# Patient Record
Sex: Male | Born: 1990 | Race: White | Hispanic: No | Marital: Married | Smoking: Current every day smoker
Health system: Southern US, Community
[De-identification: ages and names within clinical notes are randomized; demographics above are authoritative.]

## PROBLEM LIST (undated history)

## (undated) DIAGNOSIS — F1910 Other psychoactive substance abuse, uncomplicated: Secondary | ICD-10-CM

## (undated) DIAGNOSIS — F329 Major depressive disorder, single episode, unspecified: Secondary | ICD-10-CM

## (undated) DIAGNOSIS — F32A Depression, unspecified: Secondary | ICD-10-CM

## (undated) DIAGNOSIS — T7840XA Allergy, unspecified, initial encounter: Secondary | ICD-10-CM

## (undated) HISTORY — DX: Allergy, unspecified, initial encounter: T78.40XA

## (undated) HISTORY — DX: Other psychoactive substance abuse, uncomplicated: F19.10

---

## 2010-07-10 ENCOUNTER — Emergency Department (HOSPITAL_COMMUNITY): Admission: EM | Admit: 2010-07-10 | Discharge: 2010-07-10 | Payer: Self-pay | Admitting: Emergency Medicine

## 2010-07-16 ENCOUNTER — Emergency Department (HOSPITAL_COMMUNITY): Admission: EM | Admit: 2010-07-16 | Discharge: 2010-07-16 | Payer: Self-pay | Admitting: Emergency Medicine

## 2010-08-21 ENCOUNTER — Emergency Department (HOSPITAL_COMMUNITY): Admission: EM | Admit: 2010-08-21 | Discharge: 2010-08-21 | Payer: Self-pay | Admitting: Family Medicine

## 2010-12-07 ENCOUNTER — Emergency Department (HOSPITAL_COMMUNITY)
Admission: EM | Admit: 2010-12-07 | Discharge: 2010-12-07 | Disposition: A | Discharge status: Home / Self Care | Payer: Self-pay | Attending: Emergency Medicine | Admitting: Emergency Medicine

## 2010-12-07 ENCOUNTER — Emergency Department (HOSPITAL_COMMUNITY): Payer: Self-pay

## 2010-12-07 DIAGNOSIS — M25519 Pain in unspecified shoulder: Secondary | ICD-10-CM | POA: Insufficient documentation

## 2010-12-07 DIAGNOSIS — M719 Bursopathy, unspecified: Secondary | ICD-10-CM | POA: Insufficient documentation

## 2010-12-07 DIAGNOSIS — M67919 Unspecified disorder of synovium and tendon, unspecified shoulder: Secondary | ICD-10-CM | POA: Insufficient documentation

## 2012-04-14 ENCOUNTER — Emergency Department
Admission: EM | Admit: 2012-04-14 | Discharge: 2012-04-14 | Disposition: A | Discharge status: Home / Self Care | Payer: BC Managed Care – PPO | Source: Home / Self Care | Attending: Family Medicine | Admitting: Family Medicine

## 2012-04-14 DIAGNOSIS — S0501XA Injury of conjunctiva and corneal abrasion without foreign body, right eye, initial encounter: Secondary | ICD-10-CM

## 2012-04-14 DIAGNOSIS — S058X9A Other injuries of unspecified eye and orbit, initial encounter: Secondary | ICD-10-CM

## 2012-04-14 MED ORDER — HYDROCODONE-ACETAMINOPHEN 5-500 MG PO TABS: 1.0000 | ORAL_TABLET | Freq: Four times a day (QID) | ORAL | Status: AC | PRN

## 2012-04-14 MED ORDER — ERYTHROMYCIN 5 MG/GM OP OINT: TOPICAL_OINTMENT | OPHTHALMIC | Status: AC

## 2012-04-14 NOTE — ED Provider Notes (Signed)
History     CSN: [id number]  Arrival date & time 04/14/12  1304   First MD Initiated Contact with Patient 04/14/12 1355      Chief Complaint  Patient presents with  . Eye Pain    x 3 days     HPI Comments: Dakota Watson complains of watery right eye and pain for 3 days that started while he was at work. He believes the pain is due to an object in his right eye.  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  His foreign body sensation has persisted.  No changes in vision.  Patient is a 21 y.o. male presenting with eye pain. The history is provided by the patient.  Eye Pain This is a new problem. Episode onset: 3 days ago. The problem occurs constantly. The problem has not changed since onset.Associated symptoms comments: none. Exacerbated by: blinking. Nothing relieves the symptoms. Treatments tried: eye lavage. The treatment provided mild relief.    History reviewed. No pertinent past medical history.  History reviewed. No pertinent past surgical history.  Family History  Problem Relation Age of Onset  . Cancer Neg Hx     History  Substance Use Topics  . Smoking status: Current Some Day Smoker -- 4 years  . Smokeless tobacco: Never Used  . Alcohol Use: Yes      Review of Systems  Eyes: Positive for pain.  All other systems reviewed and are negative.    Allergies  Penicillins  Home Medications   Current Outpatient Rx  Name Route Sig Dispense Refill  . ERYTHROMYCIN 5 MG/GM OP OINT  Place a 1/2 inch ribbon of ointment into the lower eyelid q3 to 4hr 3.5 g 0  . HYDROCODONE-ACETAMINOPHEN 5-500 MG PO TABS Oral Take 1 tablet by mouth every 6 (six) hours as needed for pain. 10 tablet 0    BP 139/70  Pulse 76  Temp 98.1 F (36.7 C) (Oral)  Resp 16  Ht 5\' 6"  (1.676 m)  Wt 138 lb (62.596 kg)  BMI 22.27 kg/m2  SpO2 97%  Physical Exam  Nursing note and vitals  reviewed. Constitutional: He is oriented to person, place, and time. He appears well-developed and well-nourished. No distress.  HENT:  Head: Normocephalic and atraumatic.  Right Ear: External ear normal.  Left Ear: External ear normal.  Nose: Nose normal.  Mouth/Throat: Oropharynx is clear and moist.  Eyes: EOM and lids are normal. Pupils are equal, round, and reactive to light. No foreign bodies found. Right eye exhibits no chemosis, no discharge, no exudate and no hordeolum. No foreign body present in the right eye. Left eye exhibits no chemosis, no discharge and no exudate. Right conjunctiva is injected. Left conjunctiva is not injected.       Mild photophobia is present.  Lid eversion and exam reveals no foreign body.  There is a faint hint of fluorescein uptake at periphery of right cornea ("ground glass appearance").  Neck: Neck supple.  Lymphadenopathy:    He has no cervical adenopathy.  Neurological: He is alert and oriented to person, place, and time.  Skin: Skin is warm and dry. No rash noted.    ED Course  Procedures  none      1. Injury of conjunctiva and corneal abrasion of right eye without foreign body       MDM   Begin erythromycin ophthalmic ointment.  Lortab for pain. Wear sunglasses when outside. Follow up with ophthalmologist if not improved two days.        Lattie Haw, MD 04/16/12 1002

## 2012-04-14 NOTE — ED Notes (Signed)
Dakota Watson complains of watery right eye and pain for 3 days. He believes the pain is due to an object in his right eye.

## 2012-06-30 ENCOUNTER — Emergency Department (INDEPENDENT_AMBULATORY_CARE_PROVIDER_SITE_OTHER): Payer: BC Managed Care – PPO

## 2012-06-30 ENCOUNTER — Emergency Department
Admission: EM | Admit: 2012-06-30 | Discharge: 2012-06-30 | Disposition: A | Discharge status: Home / Self Care | Payer: BC Managed Care – PPO | Source: Home / Self Care | Attending: Emergency Medicine | Admitting: Emergency Medicine

## 2012-06-30 DIAGNOSIS — S335XXA Sprain of ligaments of lumbar spine, initial encounter: Secondary | ICD-10-CM

## 2012-06-30 DIAGNOSIS — S39012A Strain of muscle, fascia and tendon of lower back, initial encounter: Secondary | ICD-10-CM

## 2012-06-30 DIAGNOSIS — W1789XA Other fall from one level to another, initial encounter: Secondary | ICD-10-CM

## 2012-06-30 DIAGNOSIS — M549 Dorsalgia, unspecified: Secondary | ICD-10-CM

## 2012-06-30 MED ORDER — TRAMADOL-ACETAMINOPHEN 37.5-325 MG PO TABS: ORAL_TABLET | ORAL | Status: DC

## 2012-06-30 MED ORDER — CYCLOBENZAPRINE HCL 10 MG PO TABS: ORAL_TABLET | ORAL | Status: DC

## 2012-06-30 NOTE — ED Provider Notes (Signed)
History     CSN: [id number]  Arrival date & time 06/30/12  1654   First MD Initiated Contact with Patient 06/30/12 1700      Chief Complaint  Patient presents with  . Back Pain    Patient is a 21 y.o. male presenting with back pain. The history is provided by the patient and the spouse.  Back Pain  This is a new problem. The current episode started 6 to 12 hours ago. The problem occurs constantly. The problem has not changed since onset.Associated with: Was skateboarding and did various jumping in skateboarding moves today, and the lumbar pain started after that..--he does not recall a direct fall or "whiping out" The pain is present in the lumbar spine. Quality: Sharp. Radiates to: Right buttock. The pain is at a severity of 8/10. The pain is the same all the time. Stiffness is present: + Associated symptoms include numbness (Minimal numbness feeling in to the legs bilaterally but not into the feet). Pertinent negatives include no chest pain, no fever, no headaches, no abdominal pain, no abdominal swelling, no bowel incontinence, no perianal numbness, no bladder incontinence, no dysuria, no pelvic pain, no leg pain, no paresthesias, no paresis and no weakness. Treatments tried: Ibuprofen 800 mg a few hours ago. The treatment provided no relief.    History reviewed. No pertinent past medical history. I questioned him further about past medical history. Has a history of narcotic addiction, and is currently in an outpatient rehabilitation program. In the past, he was on some kind of medication prescribed through the rehabilitation program, but is not on that medication anymore. --We discussed that here in urgent care, we cannot prescribe any narcotics or soma (carisoprodol), and he and wife voiced understanding and agreement with this. - He and wife have lived in the area for years, but moved away and recently moved back. Their PCP is currently [facility].--Medications: Wellbutrin 150 mg  daily prescribed ongoing by his PCP.  Drug allergies: Penicillin, amoxicillin History reviewed. No pertinent past surgical history.  Family History  Problem Relation Age of Onset  . Cancer Neg Hx     History  Substance Use Topics-history of narcotic addiction   . Smoking status: Current Every Day Smoker -- 4 years  . Smokeless tobacco: Never Used  . Alcohol Use: Yes      Review of Systems  Constitutional: Negative for fever.  Cardiovascular: Negative for chest pain.  Gastrointestinal: Negative for abdominal pain and bowel incontinence.  Genitourinary: Negative for bladder incontinence, dysuria and pelvic pain.  Musculoskeletal: Positive for back pain.  Neurological: Positive for numbness (Minimal numbness feeling in to the legs bilaterally but not into the feet). Negative for weakness, headaches and paresthesias.  All other systems reviewed and are negative.    Allergies  Amoxicillin and Penicillins  Home Medications   Current Outpatient Rx  Name Route Sig Dispense Refill  . BUPROPION HCL ER (SR) 150 MG PO TB12 Oral Take 150 mg by mouth 2 (two) times daily.    . CYCLOBENZAPRINE HCL 10 MG PO TABS  One half or one tablet every 8 hours as needed for muscle relaxant 21 tablet 0  . TRAMADOL-ACETAMINOPHEN 37.5-325 MG PO TABS  1 or 2 every 4-6 hours as needed for moderate-severe pain 20 tablet 0    BP 107/74  Pulse 78  Temp 98.4 F (36.9 C) (Oral)  Resp 20  Ht 5\' 6"  (1.676 m)  Wt 140 lb (63.504 kg)  BMI 22.60  kg/m2  SpO2 98%  Physical Exam  Nursing note and vitals reviewed. Constitutional: He is oriented to person, place, and time. He appears well-developed and well-nourished. He is cooperative.  Non-toxic appearance. He appears distressed (Appears uncomfortable from low back pain.).  HENT:  Head: Normocephalic and atraumatic.  Mouth/Throat: Oropharynx is clear and moist.  Eyes: EOM are normal. Pupils are equal, round, and reactive to light. No scleral icterus.    Neck: Neck supple.  Cardiovascular: Regular rhythm and normal heart sounds.   Pulmonary/Chest: Effort normal and breath sounds normal. No respiratory distress. He has no wheezes. He has no rales. He exhibits no tenderness.  Abdominal: Soft. There is no tenderness.  Musculoskeletal:       Right hip: Normal.       Left hip: Normal.       Cervical back: He exhibits no tenderness.       Thoracic back: He exhibits no tenderness.       Lumbar back: He exhibits decreased range of motion, tenderness, bony tenderness and spasm. He exhibits no swelling, no edema, no deformity, no laceration and normal pulse.       Negative Right straight leg-raise test. Negative Left straight leg-raise test.  Negative Right Luisa Hart test. Negative Left Luisa Hart test.    Neurological: He is alert and oriented to person, place, and time. He has normal strength. He displays no atrophy, no tremor and normal reflexes. No cranial nerve deficit or sensory deficit. He exhibits normal muscle tone. Gait normal.  Reflex Scores:      Patellar reflexes are 2+ on the right side and 2+ on the left side.      Achilles reflexes are 2+ on the right side and 2+ on the left side. Skin: Skin is warm, dry and intact. No lesion and no rash noted.  Psychiatric: He has a normal mood and affect.   The above straight leg raise tests were difficult before to perform because of pain, and decreased mobility, but were negative bilaterally. And he can ambulate on his own. Addendum to lumbar back exam: He is very tender to palpation over lumbar spine and right paralumbar muscles. Some spasm right paralumbar muscles. ED Course  Procedures (including critical care time) 6:15 PM with patient and wife consent, x-ray LS-spine ordered.  Labs Reviewed - No data to display Dg Lumbar Spine 2-3 Views  06/30/2012  *RADIOLOGY REPORT*  Clinical Data: Back pain after falling from a skateboard  LUMBAR SPINE - 2-3 VIEW  Comparison: 09/14/2010  Findings:   There are five non-rib bearing lumbar type vertebral bodies. Grossly unchanged mild focal scoliotic curvature of the superior aspect of the lumbar spine, convex to the left.  No anterolisthesis or retrolisthesis.  There is unchanged mild (@ 25%) anterior wedging of the T12 and L1 vertebral bodies, grossly stable since the 08/2010 examination. Remaining vertebral body heights appear preserved.  Intervertebral disc spaces appear preserved.  Limited visualization of the bilateral SI joints and hips is normal.  Regional bowel gas pattern and soft tissues are normal.  IMPRESSION:  1.  No acute findings. 2.  Stable mild focal scoliotic curvature of the superior aspect of the lumbar spine with associated mild (approximately 25%) compression deformities of the T12 and L1 vertebral bodies.   Original Report Authenticated By: Waynard Reeds, M.D.      1. Lumbar strain       MDM  I reviewed with patient and wife that the x-ray LS-spine shows no acute abnormalities.  Specifically, compared to 08/2010 x-ray, there are no new compression fractures, or any abnormalities of the intervertebral disc spaces. I reviewed the radiology report and films myself.  Explained to patient and wife that the diagnosis likely is acute lumbar strain. He is in moderate to severe pain, and I explained limitations in pain management because of his history of narcotic addiction. He declined my offer for Toradol IM stat today. He declined any NSAID other than OTC ibuprofen, can take 800 mg by mouth every 8 hours with food when necessary moderate pain. Precautions discussed. May then take a Pepcid or other H2 blocker with this to prevent stomach upset. I gave him written prescriptions for Flexeril 10 mg, #21. One half or one every 8 hours when necessary muscle relaxant No refills and  Ultracet 37.5-325. #20, no refills. One or 2 every 4-6 hours as needed for moderate to severe pain. Precautions discussed at length. Questions invited and  answered. Other nonpharmacologic modalities discussed such as cold packs tonight, then heat tomorrow. Back care instruction sheet .  Advised him to followup with orthopedist within one week, and his wife prefers that he see someone at [facility], and they'll call for appointment tomorrow. Patient and wife agree with above plans.        Lajean Manes, MD 06/30/12 [phone]

## 2012-06-30 NOTE — ED Notes (Signed)
States his back was bothering him a couple of days ago and went skateboarding and now having intense back pain.

## 2012-07-04 ENCOUNTER — Telehealth: Payer: Self-pay | Admitting: *Deleted

## 2012-07-06 ENCOUNTER — Emergency Department
Admission: EM | Admit: 2012-07-06 | Discharge: 2012-07-06 | Disposition: A | Discharge status: Home / Self Care | Payer: BC Managed Care – PPO | Source: Home / Self Care | Attending: Family Medicine | Admitting: Family Medicine

## 2012-07-06 DIAGNOSIS — R112 Nausea with vomiting, unspecified: Secondary | ICD-10-CM

## 2012-07-06 DIAGNOSIS — F329 Major depressive disorder, single episode, unspecified: Secondary | ICD-10-CM | POA: Insufficient documentation

## 2012-07-06 DIAGNOSIS — R197 Diarrhea, unspecified: Secondary | ICD-10-CM

## 2012-07-06 DIAGNOSIS — F32A Depression, unspecified: Secondary | ICD-10-CM | POA: Insufficient documentation

## 2012-07-06 HISTORY — DX: Major depressive disorder, single episode, unspecified: F32.9

## 2012-07-06 HISTORY — DX: Depression, unspecified: F32.A

## 2012-07-06 NOTE — ED Notes (Signed)
Dakota Watson complains of nausea, vomiting and diarrhea for 4 days. He [place] the fair on Wednesday and ate fair food and his stomach as been upset since. Denies fever, chills or sweats.

## 2012-07-06 NOTE — ED Provider Notes (Signed)
History     CSN: [id number]  Arrival date & time 07/06/12  1601   First MD Initiated Contact with Patient 07/06/12 1625      Chief Complaint  Patient presents with  . Nausea    x 4 days  . Emesis  . Diarrhea      HPI Comments: Dakota Watson complains of nausea, vomiting and diarrhea for 4 days. He went to the fair 3 days ago, ate fair food and his stomach as been upset since. Denies fever, chills or sweats.  His symptoms have now essentially resolved and he can take fluids without difficulty.  His diarrhea has resolved.  The history is provided by the patient.    Past Medical History  Diagnosis Date  . Depression     History reviewed. No pertinent past surgical history.  Family History  Problem Relation Age of Onset  . Cancer Neg Hx     History  Substance Use Topics  . Smoking status: Current Every Day Smoker -- 4 years  . Smokeless tobacco: Never Used  . Alcohol Use: Yes      Review of Systems  Constitutional: Positive for fever, chills and appetite change.  HENT: Negative.   Eyes: Negative.   Respiratory: Negative.   Cardiovascular: Negative.   Gastrointestinal: Positive for nausea, vomiting and diarrhea.  Genitourinary: Negative.   Musculoskeletal: Negative.   Skin: Negative.   Neurological: Negative.     Allergies  Amoxicillin and Penicillins  Home Medications   Current Outpatient Rx  Name Route Sig Dispense Refill  . BUPROPION HCL ER (SR) 150 MG PO TB12 Oral Take 150 mg by mouth 2 (two) times daily.    . CYCLOBENZAPRINE HCL 10 MG PO TABS  One half or one tablet every 8 hours as needed for muscle relaxant 21 tablet 0  . TRAMADOL-ACETAMINOPHEN 37.5-325 MG PO TABS  1 or 2 every 4-6 hours as needed for moderate-severe pain 20 tablet 0    BP 105/72  Pulse 101  Temp 98.6 F (37 C) (Oral)  Resp 16  Ht 5\' 6"  (1.676 m)  Wt 139 lb (63.05 kg)  BMI 22.44 kg/m2  SpO2 96%  Physical Exam Nursing notes and Vital Signs reviewed. Appearance:  Patient  appears healthy, stated age, and in no acute distress Eyes:  Pupils are equal, round, and reactive to light and accomodation.  Extraocular movement is intact.  Conjunctivae are not inflamed  Nose:  Normal.  No sinus tenderness.   Pharynx:  Normal Neck:  Supple.   No adenopathy Lungs:  Clear to auscultation.  Breath sounds are equal.  Heart:  Regular rate and rhythm without murmurs, rubs, or gallops.  Abdomen:  Nontender without masses or hepatosplenomegaly.  Bowel sounds are present.  No CVA or flank tenderness.  Extremities:  No edema.  No calf tenderness Skin:  No rash present.   ED Course  Procedures        1. Nausea & vomiting   2. Diarrhea   Suspect viral gastroenteritis, resolving    MDM   Gradually increase diet as tolerated.  Avoid milk products until well. To decrease loose bowel movements mix one heaping tablespoon Citrucel (methylcellulose) in 8 oz water and drink one to three times daily.  When stools become more formed, may take Imodium (loperamide) once or twice daily to decrease stool frequency. Followup with Family Doctor if not improved in one week.         Lattie Haw, MD 07/09/12 [phone]

## 2013-01-10 ENCOUNTER — Encounter (HOSPITAL_COMMUNITY): Payer: Self-pay | Admitting: Emergency Medicine

## 2013-01-10 ENCOUNTER — Emergency Department (HOSPITAL_COMMUNITY)
Admission: EM | Admit: 2013-01-10 | Discharge: 2013-01-10 | Disposition: A | Discharge status: Home / Self Care | Payer: BC Managed Care – PPO | Attending: Emergency Medicine | Admitting: Emergency Medicine

## 2013-01-10 DIAGNOSIS — F17200 Nicotine dependence, unspecified, uncomplicated: Secondary | ICD-10-CM | POA: Insufficient documentation

## 2013-01-10 DIAGNOSIS — Z79899 Other long term (current) drug therapy: Secondary | ICD-10-CM | POA: Insufficient documentation

## 2013-01-10 DIAGNOSIS — F3289 Other specified depressive episodes: Secondary | ICD-10-CM | POA: Insufficient documentation

## 2013-01-10 DIAGNOSIS — F1120 Opioid dependence, uncomplicated: Secondary | ICD-10-CM | POA: Insufficient documentation

## 2013-01-10 DIAGNOSIS — F329 Major depressive disorder, single episode, unspecified: Secondary | ICD-10-CM | POA: Insufficient documentation

## 2013-01-10 LAB — CBC
HCT: 41.1 % (ref 39.0–52.0)
MCHC: 35.5 g/dL (ref 30.0–36.0)
Platelets: 230 10*3/uL (ref 150–400)
RDW: 11.8 % (ref 11.5–15.5)
WBC: 6.7 10*3/uL (ref 4.0–10.5)

## 2013-01-10 LAB — COMPREHENSIVE METABOLIC PANEL
ALT: 20 U/L (ref 0–53)
Alkaline Phosphatase: 100 U/L (ref 39–117)
BUN: 15 mg/dL (ref 6–23)
CO2: 27 mEq/L (ref 19–32)
GFR calc Af Amer: >90 mL/min (ref >90)
GFR calc non Af Amer: >90 mL/min (ref >90)
Glucose, Bld: 115 mg/dL — ABNORMAL HIGH (ref 70–99)
Potassium: 3.9 mEq/L (ref 3.5–5.1)
Total Bilirubin: 0.7 mg/dL (ref 0.3–1.2)
Total Protein: 7.1 g/dL (ref 6.0–8.3)

## 2013-01-10 LAB — RAPID URINE DRUG SCREEN, HOSP PERFORMED
Benzodiazepines: NOT DETECTED
Cocaine: NOT DETECTED
Opiates: NOT DETECTED

## 2013-01-10 NOTE — [facility] Counselor (Signed)
Received a call from EDP-Dr. Denton Lank to briefly meet with patient and discuss his request for opiates. Sts that he would like detox at [facility]   Writer met with patient whom stated that he was referred by a Therapist, nutritional. Explains that [facility] is a 12 month residential program to help him maintain his sobriety from opiates or any other substances he has used in the past. Patient sts he is using various opioids including, "Percocets and  "Anything with the word oxy in it". Sts he is using up to 80 mg's daily. He last used yesterday. Patient drank alcohol 1 month ago. He smoked THC 1 yr ago. No other drug use mentioned. No withdrawal symptoms at this time. No SI, HI, and AVH's.   Writer explained to patient that Opioid only detox is not offered at [facility]. Patient was offered alternative options available to assist him until he is able to start the program [facility]. Those other options include: CD-IOP, support groups such as NA, and individual therapy.

## 2013-01-10 NOTE — ED Notes (Signed)
MD at bedside. 

## 2013-01-10 NOTE — ED Notes (Signed)
Pt states " I need to detox, I was sent [facility]" Pt calm and cooperative at this time. Pt states last use was 20 hours ago.

## 2013-01-10 NOTE — ED Provider Notes (Signed)
History     CSN: [id number]  Arrival date & time 01/10/13  1533   First MD Initiated Contact with Patient 01/10/13 1553      Chief Complaint  Patient presents with  . Medical Clearance    (Consider location/radiation/quality/duration/timing/severity/associated sxs/prior treatment) The history is provided by the patient.  pt states he is here for substance abuse detox for opiate abuse.  Last used yesterday. States uses opana and/or other prescription narcotic meds. Remote hx heroine abuse. Denies etoh abuse. States last used opiates 20 hrs ago. Denies nvd. No abd pain. No fever or chills. No tremor or shakes. States recent health at baseline. States current in [facility], but states they dont do detox. Denies depression or thoughts of self harm.     Past Medical History  Diagnosis Date  . Depression     History reviewed. No pertinent past surgical history.  Family History  Problem Relation Age of Onset  . Cancer Neg Hx     History  Substance Use Topics  . Smoking status: Current Every Day Smoker -- 4 years  . Smokeless tobacco: Never Used  . Alcohol Use: Yes      Review of Systems  Constitutional: Negative for fever.  HENT: Negative for neck pain.   Eyes: Negative for redness.  Respiratory: Negative for shortness of breath.   Cardiovascular: Negative for chest pain.  Gastrointestinal: Negative for vomiting, abdominal pain and diarrhea.  Genitourinary: Negative for flank pain.  Musculoskeletal: Negative for back pain.  Skin: Negative for rash.  Neurological: Negative for headaches.  Hematological: Does not bruise/bleed easily.  Psychiatric/Behavioral: Negative for confusion.    Allergies  Amoxicillin and Penicillins  Home Medications   Current Outpatient Rx  Name  Route  Sig  Dispense  Refill  . buPROPion (WELLBUTRIN SR) 150 MG 12 hr tablet   Oral   Take 150 mg by mouth 2 (two) times daily.         . cyclobenzaprine (FLEXERIL) 10 MG  tablet      One half or one tablet every 8 hours as needed for muscle relaxant   21 tablet   0   . traMADol-acetaminophen (ULTRACET) 37.5-325 MG per tablet      1 or 2 every 4-6 hours as needed for moderate-severe pain   20 tablet   0     BP 130/73  Pulse 93  Temp(Src) 98.8 F (37.1 C) (Oral)  Resp 16  SpO2 98%  Physical Exam  Nursing note and vitals reviewed. Constitutional: He is oriented to person, place, and time. He appears well-developed and well-nourished. No distress.  HENT:  Mouth/Throat: Oropharynx is clear and moist.  Eyes: Conjunctivae are normal. Pupils are equal, round, and reactive to light.  Neck: Neck supple. No tracheal deviation present.  Cardiovascular: Normal rate, regular rhythm, normal heart sounds and intact distal pulses.   Pulmonary/Chest: Effort normal and breath sounds normal. No accessory muscle usage. No respiratory distress.  Abdominal: Soft. Bowel sounds are normal. He exhibits no distension. There is no tenderness.  Musculoskeletal: Normal range of motion. He exhibits no edema and no tenderness.  Neurological: He is alert and oriented to person, place, and time.  Skin: Skin is warm and dry. He is not diaphoretic.  Psychiatric: He has a normal mood and affect.    ED Course  Procedures (including critical care time)   Results for orders placed during the hospital encounter of 01/10/13  COMPREHENSIVE METABOLIC PANEL  Result Value Range   Sodium 136  135 - 145 mEq/L   Potassium 3.9  3.5 - 5.1 mEq/L   Chloride 99  96 - 112 mEq/L   CO2 27  19 - 32 mEq/L   Glucose, Bld 115 (*) 70 - 99 mg/dL   BUN 15  6 - 23 mg/dL   Creatinine, Ser 1.61  0.50 - 1.35 mg/dL   Calcium 9.9  8.4 - 09.6 mg/dL   Total Protein 7.1  6.0 - 8.3 g/dL   Albumin 4.6  3.5 - 5.2 g/dL   AST 24  0 - 37 U/L   ALT 20  0 - 53 U/L   Alkaline Phosphatase 100  39 - 117 U/L   Total Bilirubin 0.7  0.3 - 1.2 mg/dL   GFR calc non Af Amer >90  >90 mL/min   GFR calc Af Amer  >90  >90 mL/min  CBC      Result Value Range   WBC 6.7  4.0 - 10.5 K/uL   RBC 4.91  4.22 - 5.81 MIL/uL   Hemoglobin 14.6  13.0 - 17.0 g/dL   HCT 04.5  40.9 - 81.1 %   MCV 83.7  78.0 - 100.0 fL   MCH 29.7  26.0 - 34.0 pg   MCHC 35.5  30.0 - 36.0 g/dL   RDW 91.4  78.2 - 95.6 %   Platelets 230  150 - 400 K/uL  URINE RAPID DRUG SCREEN (HOSP PERFORMED)      Result Value Range   Opiates NONE DETECTED  NONE DETECTED   Cocaine NONE DETECTED  NONE DETECTED   Benzodiazepines NONE DETECTED  NONE DETECTED   Amphetamines NONE DETECTED  NONE DETECTED   Tetrahydrocannabinol NONE DETECTED  NONE DETECTED   Barbiturates NONE DETECTED  NONE DETECTED  ETHANOL      Result Value Range   Alcohol, Ethyl (B) <11  0 - 11 mg/dL       MDM  Labs.  Reviewed nursing notes and prior charts for additional history.   Act, [place], has assessed, states no inpt opiate detox beds available, and recommends d/c w outpt referrals.  Referrals provided to pt by act, pt calm, alert, normal vitals, [facility], appears stable for d/c.         Suzi Roots, MD 01/10/13 1736

## 2015-03-08 ENCOUNTER — Encounter (HOSPITAL_COMMUNITY): Payer: Self-pay | Admitting: Emergency Medicine

## 2015-03-08 ENCOUNTER — Emergency Department (HOSPITAL_COMMUNITY)
Admission: EM | Admit: 2015-03-08 | Discharge: 2015-03-08 | Disposition: A | Discharge status: Home / Self Care | Payer: 59 | Attending: Emergency Medicine | Admitting: Emergency Medicine

## 2015-03-08 DIAGNOSIS — Z8659 Personal history of other mental and behavioral disorders: Secondary | ICD-10-CM | POA: Diagnosis not present

## 2015-03-08 DIAGNOSIS — S30812A Abrasion of penis, initial encounter: Secondary | ICD-10-CM | POA: Diagnosis not present

## 2015-03-08 DIAGNOSIS — Z720 Tobacco use: Secondary | ICD-10-CM | POA: Insufficient documentation

## 2015-03-08 DIAGNOSIS — Y9389 Activity, other specified: Secondary | ICD-10-CM | POA: Diagnosis not present

## 2015-03-08 DIAGNOSIS — T148XXA Other injury of unspecified body region, initial encounter: Secondary | ICD-10-CM

## 2015-03-08 DIAGNOSIS — X58XXXA Exposure to other specified factors, initial encounter: Secondary | ICD-10-CM | POA: Insufficient documentation

## 2015-03-08 DIAGNOSIS — Z880 Allergy status to penicillin: Secondary | ICD-10-CM | POA: Diagnosis not present

## 2015-03-08 DIAGNOSIS — Y9289 Other specified places as the place of occurrence of the external cause: Secondary | ICD-10-CM | POA: Insufficient documentation

## 2015-03-08 DIAGNOSIS — R1030 Lower abdominal pain, unspecified: Secondary | ICD-10-CM | POA: Diagnosis present

## 2015-03-08 DIAGNOSIS — Y998 Other external cause status: Secondary | ICD-10-CM | POA: Diagnosis not present

## 2015-03-08 NOTE — ED Notes (Signed)
Pt c/o swelling and scab to groin area x 2 days. Pt states he did not know if its from having too much sex. Denies d/c

## 2015-03-08 NOTE — Discharge Instructions (Signed)
Use Neosporin as directed Abrasion An abrasion is a cut or scrape of the skin. Abrasions do not extend through all layers of the skin and most heal within 10 days. It is important to care for your abrasion properly to prevent infection. CAUSES  Most abrasions are caused by falling on, or gliding across, the ground or other surface. When your skin rubs on something, the outer and inner layer of skin rubs off, causing an abrasion. DIAGNOSIS  Your caregiver will be able to diagnose an abrasion during a physical exam.  TREATMENT  Your treatment depends on how large and deep the abrasion is. Generally, your abrasion will be cleaned with water and a mild soap to remove any dirt or debris. An antibiotic ointment may be put over the abrasion to prevent an infection. A bandage (dressing) may be wrapped around the abrasion to keep it from getting dirty.  You may need a tetanus shot if:  You cannot remember when you had your last tetanus shot.  You have never had a tetanus shot.  The injury broke your skin. If you get a tetanus shot, your arm may swell, get red, and feel warm to the touch. This is common and not a problem. If you need a tetanus shot and you choose not to have one, there is a rare chance of getting tetanus. Sickness from tetanus can be serious.  HOME CARE INSTRUCTIONS   If a dressing was applied, change it at least once a day or as directed by your caregiver. If the bandage sticks, soak it off with warm water.   Wash the area with water and a mild soap to remove all the ointment 2 times a day. Rinse off the soap and pat the area dry with a clean towel.   Reapply any ointment as directed by your caregiver. This will help prevent infection and keep the bandage from sticking. Use gauze over the wound and under the dressing to help keep the bandage from sticking.   Change your dressing right away if it becomes wet or dirty.   Only take over-the-counter or prescription medicines for  pain, discomfort, or fever as directed by your caregiver.   Follow up with your caregiver within 24-48 hours for a wound check, or as directed. If you were not given a wound-check appointment, look closely at your abrasion for redness, swelling, or pus. These are signs of infection. SEEK IMMEDIATE MEDICAL CARE IF:   You have increasing pain in the wound.   You have redness, swelling, or tenderness around the wound.   You have pus coming from the wound.   You have a fever or persistent symptoms for more than 2-3 days.  You have a fever and your symptoms suddenly get worse.  You have a bad smell coming from the wound or dressing.  MAKE SURE YOU:   Understand these instructions.  Will watch your condition.  Will get help right away if you are not doing well or get worse. Document Released: 06/21/2005 Document Revised: 08/28/2012 Document Reviewed: 08/15/2011 [facility] Patient Information 2015 [place], [place]. This information is not intended to replace advice given to you by your health care provider. Make sure you discuss any questions you have with your health care provider.
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
## 2015-03-15 ENCOUNTER — Encounter (HOSPITAL_COMMUNITY): Payer: Self-pay | Admitting: *Deleted

## 2015-03-15 ENCOUNTER — Emergency Department (HOSPITAL_COMMUNITY)
Admission: EM | Admit: 2015-03-15 | Discharge: 2015-03-15 | Disposition: A | Discharge status: Home / Self Care | Payer: 59 | Attending: Emergency Medicine | Admitting: Emergency Medicine

## 2015-03-15 ENCOUNTER — Emergency Department (HOSPITAL_COMMUNITY): Payer: 59

## 2015-03-15 DIAGNOSIS — Y92410 Unspecified street and highway as the place of occurrence of the external cause: Secondary | ICD-10-CM | POA: Insufficient documentation

## 2015-03-15 DIAGNOSIS — L02413 Cutaneous abscess of right upper limb: Secondary | ICD-10-CM | POA: Diagnosis not present

## 2015-03-15 DIAGNOSIS — Z720 Tobacco use: Secondary | ICD-10-CM | POA: Diagnosis not present

## 2015-03-15 DIAGNOSIS — Y9389 Activity, other specified: Secondary | ICD-10-CM | POA: Diagnosis not present

## 2015-03-15 DIAGNOSIS — Y998 Other external cause status: Secondary | ICD-10-CM | POA: Diagnosis not present

## 2015-03-15 DIAGNOSIS — S4991XA Unspecified injury of right shoulder and upper arm, initial encounter: Secondary | ICD-10-CM | POA: Diagnosis present

## 2015-03-15 DIAGNOSIS — S43401A Unspecified sprain of right shoulder joint, initial encounter: Secondary | ICD-10-CM | POA: Insufficient documentation

## 2015-03-15 DIAGNOSIS — Z8659 Personal history of other mental and behavioral disorders: Secondary | ICD-10-CM | POA: Insufficient documentation

## 2015-03-15 DIAGNOSIS — Z880 Allergy status to penicillin: Secondary | ICD-10-CM | POA: Diagnosis not present

## 2015-03-15 MED ORDER — SULFAMETHOXAZOLE-TRIMETHOPRIM 800-160 MG PO TABS: 1.0000 | ORAL_TABLET | Freq: Two times a day (BID) | ORAL | Status: AC

## 2015-03-15 MED ORDER — LIDOCAINE-EPINEPHRINE (PF) 2 %-1:200000 IJ SOLN
10.0000 mL | Freq: Once | INTRAMUSCULAR | Status: AC
  Administered 2015-03-15: 10 mL via INTRADERMAL

## 2015-03-15 MED ORDER — IBUPROFEN 600 MG PO TABS: 600.0000 mg | ORAL_TABLET | Freq: Four times a day (QID) | ORAL | Status: DC | PRN

## 2015-03-15 MED ORDER — CEPHALEXIN 500 MG PO CAPS: 500.0000 mg | ORAL_CAPSULE | Freq: Four times a day (QID) | ORAL | Status: DC

## 2015-03-15 MED ORDER — TRAMADOL HCL 50 MG PO TABS: 50.0000 mg | ORAL_TABLET | Freq: Four times a day (QID) | ORAL | Status: DC | PRN

## 2015-03-15 MED ORDER — OXYCODONE-ACETAMINOPHEN 5-325 MG PO TABS
1.0000 | ORAL_TABLET | Freq: Once | ORAL | Status: AC
  Administered 2015-03-15: 1 via ORAL
  Filled 2015-03-15: qty 1
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
## 2015-03-15 NOTE — Discharge Instructions (Signed)
Ibuprofen and tramadol for pain. Keep arm elevated. Take both keflex and bactrim for infection, take both until all gone. Follow up with primary care doctor or orthopedics for shoulder pain. Return in two days or see your doctor for recheck of forearm infection.     Abscess Care After An abscess (also called a boil or furuncle) is an infected area that contains a collection of pus. Signs and symptoms of an abscess include pain, tenderness, redness, or hardness, or you may feel a moveable soft area under your skin. An abscess can occur anywhere in the body. The infection may spread to surrounding tissues causing cellulitis. A cut (incision) by the surgeon was made over your abscess and the pus was drained out. Gauze may have been packed into the space to provide a drain that will allow the cavity to heal from the inside outwards. The boil may be painful for 5 to 7 days. Most people with a boil do not have high fevers. Your abscess, if seen early, may not have localized, and may not have been lanced. If not, another appointment may be required for this if it does not get better on its own or with medications. HOME CARE INSTRUCTIONS   Only take over-the-counter or prescription medicines for pain, discomfort, or fever as directed by your caregiver.  When you bathe, soak and then remove gauze or iodoform packs at least daily or as directed by your caregiver. You may then wash the wound gently with mild soapy water. Repack with gauze or do as your caregiver directs. SEEK IMMEDIATE MEDICAL CARE IF:   You develop increased pain, swelling, redness, drainage, or bleeding in the wound site.  You develop signs of generalized infection including muscle aches, chills, fever, or a general ill feeling.  An oral temperature above 102 F (38.9 C) develops, not controlled by medication. See your caregiver for a recheck if you develop any of the symptoms described above. If medications (antibiotics) were prescribed,  take them as directed. Document Released: 03/30/2005 Document Revised: 12/04/2011 Document Reviewed: 11/25/2007 [employer] Patient Information 2015 [place], [place]. This information is not intended to replace advice given to you by your health care provider. Make sure you discuss any questions you have with your health care provider. Shoulder Sprain A shoulder sprain is the result of damage to the tough, fiber-like tissues (ligaments) that help hold your shoulder in place. The ligaments may be stretched or torn. Besides the main shoulder joint (the ball and socket), there are several smaller joints that connect the bones in this area. A sprain usually involves one of those joints. Most often it is the acromioclavicular (or AC) joint. That is the joint that connects the collarbone (clavicle) and the shoulder blade (scapula) at the top point of the shoulder blade (acromion). A shoulder sprain is a mild form of what is called a shoulder separation. Recovering from a shoulder sprain may take some time. For some, pain lingers for several months. Most people recover without long term problems. CAUSES   A shoulder sprain is usually caused by some kind of trauma. This might be:  Falling on an outstretched arm.  Being hit hard on the shoulder.  Twisting the arm.  Shoulder sprains are more likely to occur in people who:  Play sports.  Have balance or coordination problems. SYMPTOMS   Pain when you move your shoulder.  Limited ability to move the shoulder.  Swelling and tenderness on top of the shoulder.  Redness or warmth in the  shoulder.  Bruising.  A change in the shape of the shoulder. DIAGNOSIS  Your healthcare provider may:  Ask about your symptoms.  Ask about recent activity that might have caused those symptoms.  Examine your shoulder. You may be asked to do simple exercises to test movement. The other shoulder will be examined for comparison.  Order some tests that provide a  look inside the body. They can show the extent of the injury. The tests could include:  X-rays.  CT (computed tomography) scan.  MRI (magnetic resonance imaging) scan. RISKS AND COMPLICATIONS  Loss of full shoulder motion.  Ongoing shoulder pain. TREATMENT  How long it takes to recover from a shoulder sprain depends on how severe it was. Treatment options may include:  Rest. You should not use the arm or shoulder until it heals.  Ice. For 2 or 3 days after the injury, put an ice pack on the shoulder up to 4 times a day. It should stay on for 15 to 20 minutes each time. Wrap the ice in a towel so it does not touch your skin.  Over-the-counter medicine to relieve pain.  A sling or brace. This will keep the arm still while the shoulder is healing.  Physical therapy or rehabilitation exercises. These will help you regain strength and motion. Ask your healthcare provider when it is OK to begin these exercises.  Surgery. The need for surgery is rare with a sprained shoulder, but some people may need surgery to keep the joint in place and reduce pain. HOME CARE INSTRUCTIONS   Ask your healthcare provider about what you should and should not do while your shoulder heals.  Make sure you know how to apply ice to the correct area of your shoulder.  Talk with your healthcare provider about which medications should be used for pain and swelling.  If rehabilitation therapy will be needed, ask your healthcare provider to refer you to a therapist. If it is not recommended, then ask about at-home exercises. Find out when exercise should begin. SEEK MEDICAL CARE IF:  Your pain, swelling, or redness at the joint increases. SEEK IMMEDIATE MEDICAL CARE IF:   You have a fever.  You cannot move your arm or shoulder. Document Released: 01/28/2009 Document Revised: 12/04/2011 Document Reviewed: 01/28/2009 [facility] Patient Information 2015 [place], [place]. This information is not intended to replace  advice given to you by your health care provider. Make sure you discuss any questions you have with your health care provider.
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
## 2015-05-26 ENCOUNTER — Ambulatory Visit (INDEPENDENT_AMBULATORY_CARE_PROVIDER_SITE_OTHER): Payer: 59 | Admitting: Family Medicine

## 2015-05-26 VITALS — BP 110/73 | HR 100 | Temp 98.7°F | Resp 17 | Ht 66.5 in | Wt 137.0 lb

## 2015-05-26 DIAGNOSIS — F4322 Adjustment disorder with anxiety: Secondary | ICD-10-CM | POA: Diagnosis not present

## 2015-05-26 DIAGNOSIS — F1921 Other psychoactive substance dependence, in remission: Secondary | ICD-10-CM

## 2015-05-26 MED ORDER — NALTREXONE HCL 50 MG PO TABS: 50.0000 mg | ORAL_TABLET | Freq: Every day | ORAL | Status: AC

## 2015-05-26 MED ORDER — GABAPENTIN 600 MG PO TABS: 600.0000 mg | ORAL_TABLET | Freq: Three times a day (TID) | ORAL | Status: DC

## 2015-05-26 MED ORDER — PROPRANOLOL HCL 20 MG PO TABS: ORAL_TABLET | ORAL | Status: AC

## 2015-05-26 MED ORDER — QUETIAPINE FUMARATE 100 MG PO TABS: 100.0000 mg | ORAL_TABLET | Freq: Every day | ORAL | Status: AC

## 2015-05-26 NOTE — Patient Instructions (Signed)
Best of luck to you in your recovery.  Continue your medications as usual You can take an inderal (propranolol) if needed prior to public speaking or other events that cause nervousness

## 2015-05-26 NOTE — Progress Notes (Signed)
[facility] [address] [place] [address] [phone]  Date:  05/26/2015   Name:  Dakota Watson   DOB:  1990-12-25   MRN:  [id number]  PCP:  PROVIDER NOT IN SYSTEM    Chief Complaint: Medication Refill   History of Present Illness:  Dakota Watson is a 24 y.o. very pleasant male patient who presents with the following:  Here as a new patient seeking refills of his medication.  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  He needs refills of his medications (which he was discharged with) as he does not have a PCP here  He was at [facility] center of [place]  He feels that so far he is doing ok- this is the first time he has been clean in about 10 years   He had been on wellbutrin in the past and might like to get back on this at some point- however he notes that he sometimes has some SE when he first goes back on it. Would like to wait and make sure he is doing well with his recovery prior to making this change  He also used some inderal while inpt- this helped him with nervousness.  He is doing an IOP and has to do public speaking through this program. Inderal helped him in this regard  He is currently living at a halfway house  Patient Active Problem List   Diagnosis Date Noted  . Depression     Past Medical History  Diagnosis Date  . Depression   . Allergy   . Substance abuse     History reviewed. No pertinent past surgical history.  Social History  Substance Use Topics  . Smoking status: Current Every Day Smoker -- 0.50 packs/day for 5 years    Types: Cigarettes  . Smokeless tobacco: Never Used  . Alcohol Use: 0.0 oz/week    0 Standard drinks or equivalent per week    Family History  Problem Relation Age of Onset  . Cancer Neg Hx     Allergies  Allergen Reactions  . Amoxicillin Rash  . Penicillins Rash    Medication list has been reviewed and updated.  Current Outpatient Prescriptions on File Prior to Visit  Medication  Sig Dispense Refill  . ibuprofen (ADVIL,MOTRIN) 600 MG tablet Take 1 tablet (600 mg total) by mouth every 6 (six) hours as needed. 30 tablet 0   No current facility-administered medications on file prior to visit.    Review of Systems:  As per HPI- otherwise negative.   Physical Examination: Filed Vitals:   05/26/15 1444  BP: 110/73  Pulse: 100  Temp: 98.7 F (37.1 C)  Resp: 17   Filed Vitals:   05/26/15 1444  Height: 5' 6.5" (1.689 m)  Weight: 137 lb (62.143 kg)   Body mass index is 21.78 kg/(m^2). Ideal Body Weight: Weight in (lb) to have BMI = 25: 156.9  GEN: WDWN, NAD, Non-toxic, A & O x 3, looks well HEENT: Atraumatic, Normocephalic. Neck supple. No masses, No LAD. Ears and Nose: No external deformity. CV: RRR, No M/G/R. No JVD. No thrill. No extra heart sounds. PULM: CTA B, no wheezes, crackles, rhonchi. No retractions. No resp. distress. No accessory muscle use. EXTR: No c/c/e NEURO Normal gait.  PSYCH: Normally interactive. Conversant. Not depressed or anxious appearing.  Calm demeanor.    Assessment and Plan: Drug dependence, in remission - Plan: QUEtiapine (SEROQUEL) 100 MG tablet, naltrexone (DEPADE) 50 MG  tablet, gabapentin (NEURONTIN) 600 MG tablet  Adjustment disorder with anxious mood - Plan: propranolol (INDERAL) 20 MG tablet  Doing well with his recovery.   Refilled seroquel, naltrexone, neurontin, inderal to use as needed for public speaking nervousness   Signed Abbe Amsterdam, MD

## 2015-07-05 ENCOUNTER — Ambulatory Visit (INDEPENDENT_AMBULATORY_CARE_PROVIDER_SITE_OTHER): Payer: 59 | Admitting: Family Medicine

## 2015-07-05 VITALS — BP 102/66 | HR 83 | Temp 99.0°F | Resp 18 | Wt 142.0 lb

## 2015-07-05 DIAGNOSIS — L03119 Cellulitis of unspecified part of limb: Secondary | ICD-10-CM

## 2015-07-05 DIAGNOSIS — F1921 Other psychoactive substance dependence, in remission: Secondary | ICD-10-CM | POA: Diagnosis not present

## 2015-07-05 DIAGNOSIS — T7840XA Allergy, unspecified, initial encounter: Secondary | ICD-10-CM

## 2015-07-05 MED ORDER — DOXYCYCLINE HYCLATE 100 MG PO TABS: 100.0000 mg | ORAL_TABLET | Freq: Two times a day (BID) | ORAL | Status: DC

## 2015-07-05 MED ORDER — GABAPENTIN 600 MG PO TABS: 600.0000 mg | ORAL_TABLET | Freq: Three times a day (TID) | ORAL | Status: AC

## 2015-07-05 MED ORDER — METHYLPREDNISOLONE ACETATE 80 MG/ML IJ SUSP
80.0000 mg | Freq: Once | INTRAMUSCULAR | Status: AC
  Administered 2015-07-05: 80 mg via INTRAMUSCULAR

## 2015-07-05 NOTE — Progress Notes (Signed)
   Subjective:    Patient ID: Dakota Watson, male    DOB: 04/22/1991, 24 y.o.   MRN: [id number] This chart was scribed for Elvina Sidle, MD by Jolene Provost, Medical Scribe. This patient was seen in Room 6 and the patient's care was started a 5:38 PM.  Chief Complaint  Patient presents with  . Rash    itchy x2 weeks     HPI HPI Comments: [name] is a 24 y.o. male who presents to [facility] complaining of itchiness all over his body. He states that he gave himself a tattoo recently and thinks it may have gotten infected. He also states that he took amoxicillin two days ago, because he was concerned that he had an infection and since that time he has been severely itchy. He has been allergic to amoxicillin in the past. He has not taken the amoxicillin in two days.    Review of Systems  Constitutional: Negative for fever and chills.  Respiratory: Negative for cough, choking, chest tightness and shortness of breath.   Skin: Positive for color change, rash and wound.       Objective:   Physical Exam  Constitutional: He is oriented to person, place, and time. He appears well-developed and well-nourished. No distress.  Pt is scratching his skin uncontrollably with visible excoriations, mild erythema surrounding recent tattoo sites.   HENT:  Head: Normocephalic and atraumatic.  Eyes: Pupils are equal, round, and reactive to light.  Neck: Neck supple.  Cardiovascular: Normal rate.   Pulmonary/Chest: Effort normal. No respiratory distress.  Musculoskeletal: Normal range of motion.  Neurological: He is alert and oriented to person, place, and time. Coordination normal.  Skin: Skin is warm and dry. He is not diaphoretic.  Psychiatric: He has a normal mood and affect. His behavior is normal.  Nursing note and vitals reviewed.   Filed Vitals:   07/05/15 1723  BP: 102/66  Pulse: 83  Temp: 99 F (37.2 C)  TempSrc: Oral  Resp: 18  Weight: 142 lb (64.411 kg)  SpO2: 98%         Assessment & Plan:   This chart was scribed in my presence and reviewed by me personally.    ICD-9-CM ICD-10-CM   1. Allergic reaction, initial encounter 995.3 T78.40XA methylPREDNISolone acetate (DEPO-MEDROL) injection 80 mg  2. Cellulitis of lower extremity, unspecified laterality 682.6 L03.119 doxycycline (VIBRA-TABS) 100 MG tablet  3. Drug dependence, in remission (HCC) 304.93 F19.21 gabapentin (NEURONTIN) 600 MG tablet     Signed, Elvina Sidle, MD   By signing my name below, I, Javier Docker, attest that this documentation has been prepared under the direction and in the presence of Elvina Sidle, MD. Electronically Signed: Javier Docker, ER Scribe. 07/05/2015. 5:39 PM.

## 2015-07-05 NOTE — Patient Instructions (Signed)
Avoid tattoos until this settles down. There is a good chance that you're allergic to amoxicillin, but you could also be allergic to the Ink in the tattoo dye.

## 2015-09-09 ENCOUNTER — Encounter (HOSPITAL_COMMUNITY): Payer: Self-pay | Admitting: Emergency Medicine

## 2015-09-09 ENCOUNTER — Observation Stay (HOSPITAL_COMMUNITY)
Admission: EM | Admit: 2015-09-09 | Discharge: 2015-09-09 | Disposition: A | Discharge status: Home / Self Care | Payer: 59 | Attending: Internal Medicine | Admitting: Internal Medicine

## 2015-09-09 DIAGNOSIS — T50904A Poisoning by unspecified drugs, medicaments and biological substances, undetermined, initial encounter: Secondary | ICD-10-CM | POA: Diagnosis not present

## 2015-09-09 DIAGNOSIS — T50992A Poisoning by other drugs, medicaments and biological substances, intentional self-harm, initial encounter: Secondary | ICD-10-CM

## 2015-09-09 DIAGNOSIS — T1491 Suicide attempt: Secondary | ICD-10-CM

## 2015-09-09 DIAGNOSIS — G928 Other toxic encephalopathy: Secondary | ICD-10-CM | POA: Diagnosis present

## 2015-09-09 DIAGNOSIS — T50901A Poisoning by unspecified drugs, medicaments and biological substances, accidental (unintentional), initial encounter: Secondary | ICD-10-CM | POA: Diagnosis present

## 2015-09-09 DIAGNOSIS — G92 Toxic encephalopathy: Secondary | ICD-10-CM | POA: Insufficient documentation

## 2015-09-09 DIAGNOSIS — T438X2A Poisoning by other psychotropic drugs, intentional self-harm, initial encounter: Principal | ICD-10-CM | POA: Insufficient documentation

## 2015-09-09 DIAGNOSIS — Y92199 Unspecified place in other specified residential institution as the place of occurrence of the external cause: Secondary | ICD-10-CM | POA: Insufficient documentation

## 2015-09-09 DIAGNOSIS — F1910 Other psychoactive substance abuse, uncomplicated: Secondary | ICD-10-CM | POA: Insufficient documentation

## 2015-09-09 DIAGNOSIS — Z79899 Other long term (current) drug therapy: Secondary | ICD-10-CM | POA: Insufficient documentation

## 2015-09-09 DIAGNOSIS — T50902A Poisoning by unspecified drugs, medicaments and biological substances, intentional self-harm, initial encounter: Secondary | ICD-10-CM

## 2015-09-09 DIAGNOSIS — F329 Major depressive disorder, single episode, unspecified: Secondary | ICD-10-CM | POA: Insufficient documentation

## 2015-09-09 DIAGNOSIS — F17210 Nicotine dependence, cigarettes, uncomplicated: Secondary | ICD-10-CM | POA: Insufficient documentation

## 2015-09-09 LAB — COMPREHENSIVE METABOLIC PANEL
ALBUMIN: 5.6 g/dL — AB (ref 3.5–5.0)
ALT: 27 U/L (ref 17–63)
AST: 29 U/L (ref 15–41)
Alkaline Phosphatase: 121 U/L (ref 38–126)
Anion gap: 10 (ref 5–15)
BILIRUBIN TOTAL: 1.2 mg/dL (ref 0.3–1.2)
BUN: 24 mg/dL — AB (ref 6–20)
CHLORIDE: 106 mmol/L (ref 101–111)
CO2: 29 mmol/L (ref 22–32)
Calcium: 10 mg/dL (ref 8.9–10.3)
Creatinine, Ser: 1.03 mg/dL (ref 0.61–1.24)
GFR calc Af Amer: >60 mL/min (ref >60)
GFR calc non Af Amer: >60 mL/min (ref >60)
GLUCOSE: 121 mg/dL — AB (ref 65–99)
POTASSIUM: 4.2 mmol/L (ref 3.5–5.1)
Sodium: 145 mmol/L (ref 135–145)
TOTAL PROTEIN: 7.8 g/dL (ref 6.5–8.1)

## 2015-09-09 LAB — CBC WITH DIFFERENTIAL/PLATELET
BASOS ABS: 0 10*3/uL (ref 0.0–0.1)
Basophils Relative: 0 %
EOS PCT: 2 %
Eosinophils Absolute: 0.2 10*3/uL (ref 0.0–0.7)
HEMATOCRIT: 45.3 % (ref 39.0–52.0)
Hemoglobin: 15.6 g/dL (ref 13.0–17.0)
LYMPHS PCT: 14 %
Lymphs Abs: 1.3 10*3/uL (ref 0.7–4.0)
MCH: 30.7 pg (ref 26.0–34.0)
MCHC: 34.4 g/dL (ref 30.0–36.0)
MCV: 89.2 fL (ref 78.0–100.0)
Monocytes Absolute: 0.8 10*3/uL (ref 0.1–1.0)
Monocytes Relative: 9 %
NEUTROS ABS: 7.3 10*3/uL (ref 1.7–7.7)
Neutrophils Relative %: 75 %
PLATELETS: 299 10*3/uL (ref 150–400)
RBC: 5.08 MIL/uL (ref 4.22–5.81)
RDW: 12.3 % (ref 11.5–15.5)
WBC: 9.6 10*3/uL (ref 4.0–10.5)

## 2015-09-09 LAB — MRSA PCR SCREENING: MRSA BY PCR: NEGATIVE

## 2015-09-09 LAB — RAPID URINE DRUG SCREEN, HOSP PERFORMED
Amphetamines: NOT DETECTED
BARBITURATES: NOT DETECTED
BENZODIAZEPINES: NOT DETECTED
COCAINE: NOT DETECTED
Opiates: NOT DETECTED
Tetrahydrocannabinol: NOT DETECTED

## 2015-09-09 LAB — SALICYLATE LEVEL: Salicylate Lvl: <4 mg/dL (ref 2.8–30.0)

## 2015-09-09 LAB — ETHANOL: Alcohol, Ethyl (B): <5 mg/dL (ref <5)

## 2015-09-09 LAB — ACETAMINOPHEN LEVEL: Acetaminophen (Tylenol), Serum: <10 ug/mL — ABNORMAL LOW (ref 10–30)

## 2015-09-09 MED ORDER — NICOTINE 14 MG/24HR TD PT24
14.0000 mg | MEDICATED_PATCH | Freq: Every day | TRANSDERMAL | Status: DC
  Filled 2015-09-09: qty 1

## 2015-09-09 MED ORDER — FOLIC ACID 1 MG PO TABS
1.0000 mg | ORAL_TABLET | Freq: Every day | ORAL | Status: DC
  Administered 2015-09-09: 1 mg via ORAL
  Filled 2015-09-09: qty 1

## 2015-09-09 MED ORDER — SODIUM CHLORIDE 0.9 % IV SOLN
INTRAVENOUS | Status: DC
  Administered 2015-09-09: 05:00:00 via INTRAVENOUS

## 2015-09-09 MED ORDER — HEPARIN SODIUM (PORCINE) 5000 UNIT/ML IJ SOLN
5000.0000 [IU] | Freq: Three times a day (TID) | INTRAMUSCULAR | Status: DC
  Administered 2015-09-09: 5000 [IU] via SUBCUTANEOUS
  Filled 2015-09-09 (×2): qty 1

## 2015-09-09 MED ORDER — ONDANSETRON HCL 4 MG/2ML IJ SOLN
4.0000 mg | Freq: Three times a day (TID) | INTRAMUSCULAR | Status: DC | PRN
  Administered 2015-09-09: 4 mg via INTRAVENOUS
  Filled 2015-09-09: qty 2

## 2015-09-09 MED ORDER — SODIUM CHLORIDE 0.9 % IJ SOLN: 3.0000 mL | Freq: Two times a day (BID) | INTRAMUSCULAR | Status: DC

## 2015-09-09 MED ORDER — VITAMIN B-1 100 MG PO TABS
100.0000 mg | ORAL_TABLET | Freq: Every day | ORAL | Status: DC
  Administered 2015-09-09: 100 mg via ORAL
  Filled 2015-09-09: qty 1

## 2015-09-09 MED ORDER — SODIUM CHLORIDE 0.9 % IV SOLN
INTRAVENOUS | Status: DC
  Administered 2015-09-09: 03:00:00 via INTRAVENOUS

## 2015-09-09 NOTE — ED Notes (Signed)
Pt. On monitor. 

## 2015-09-09 NOTE — ED Notes (Signed)
EKG given to EDP,Knapp,I.MD., for review. 

## 2015-09-09 NOTE — ED Notes (Addendum)
Spoke with Lorri from [employer] [employer], monitor -Medical clearance labs -EKG -Seizure precautions -Benzo's for any seizure activities -Marthe PatchLorri will call back with further information

## 2015-09-09 NOTE — Discharge Summary (Signed)
Physician Discharge Summary  Dakota Watson [id number] DOB: September 03, 1991 DOA: 09/09/2015  PCP: PROVIDER NOT IN SYSTEM  Admit date: 09/09/2015 Discharge date: 09/09/2015  Time spent: 25 minutes  Recommendations for Outpatient Follow-up:  1. Left AMA  Discharge Diagnoses:  Principal Problem:   Drug overdose, intentional (HCC) Active Problems:   Overdose   Drug-induced encephalopathy   Discharge Condition: Left AMA   Filed Weights   09/09/15 0500  Weight: 64.638 kg (142 lb 8 oz)    History of present illness:  24 y.o. male with h/o heroin abuse, he presents to the ED due to a drug overdose that occurred PTA. He explains that he took Phenibut: 10 scoops of 500mg  mixing powder that he ordered over the internet from [place]. Apparently Phenibut is a powdered substance that acts as an agonist of the GABA receptor similar to baclofen and /or gabapentin. Does cross the blood brain barrier. Can cause respiratory depression and bradycardia. No specific antidote or cardiotoxicity. But can take up to 36 hours to wash out of the patients system apparently.  Hospital Course:  1-Drug-induced encephalopathy due to overdose of Phenibut - -patient improving and now AAOX3 with supportive care -still with episodes of Bradycardia -following rec's from poison control and given the fact that the medication take up to 36 hours before washing out of his body. -patient decide to take his chances and sing out AMA -he was clear from psychiatry stand point and safe to pursuit detox/outpatient psychiatric treatment.  Procedures:    Consultations:  Psychiatry   Discharge Exam: Filed Vitals:   09/09/15 0500 09/09/15 1306  BP: 124/76 120/67  Pulse: 48 70  Temp: 98.2 F (36.8 C) 98.3 F (36.8 C)  Resp: 20 20    General: AAOX3, denies any acute complaints. No fever. Still with episodes of bradycardia. Patient denies SI and despite explanations and concerns from poison control services to  monitor him for 24 hours, he decide to leave AMA.   Discharge Instructions  Discharge Medication List as of 09/09/2015  5:20 PM    CONTINUE these medications which have NOT CHANGED   Details  !! gabapentin (NEURONTIN) 800 MG tablet Take 800 mg by mouth 4 (four) times daily., Starting 08/30/2015, Until Discontinued, Historical Med    !! gabapentin (NEURONTIN) 600 MG tablet Take 1 tablet (600 mg total) by mouth 3 (three) times daily., Starting 07/05/2015, Until Discontinued, Normal    naltrexone (DEPADE) 50 MG tablet Take 1 tablet (50 mg total) by mouth daily., Starting 05/26/2015, Until Discontinued, Normal    propranolol (INDERAL) 20 MG tablet Take 1 tablet by mouth daily as needed for anxiety, Normal    QUEtiapine (SEROQUEL) 100 MG tablet Take 1 tablet (100 mg total) by mouth at bedtime., Starting 05/26/2015, Until Discontinued, Normal     !! - Potential duplicate medications found. Please discuss with provider.     Allergies  Allergen Reactions  . Amoxicillin Rash  . Penicillins Rash    Has patient had a PCN reaction causing immediate rash, facial/tongue/throat swelling, SOB or lightheadedness with hypotension: Yes Has patient had a PCN reaction causing severe rash involving mucus membranes or skin necrosis: No Has patient had a PCN reaction that required hospitalization: No Has patient had a PCN reaction occurring within the last 10 years: No If all of the above answers are "NO", then may proceed with Cephalosporin use.     The results of significant diagnostics from this hospitalization (including imaging, microbiology, ancillary and laboratory) are listed below for  reference.    Significant Diagnostic Studies: No results found.  Microbiology: Recent Results (from the past 240 hour(s))  MRSA PCR Screening     Status: None   Collection Time: 09/09/15  5:38 AM  Result Value Ref Range Status   MRSA by PCR NEGATIVE NEGATIVE Final    Comment:        The GeneXpert MRSA Assay  (FDA approved for NASAL specimens only), is one component of a comprehensive MRSA colonization surveillance program. It is not intended to diagnose MRSA infection nor to guide or monitor treatment for MRSA infections.      Labs: Basic Metabolic Panel:  Recent Labs Lab 09/09/15 0229  NA 145  K 4.2  CL 106  CO2 29  GLUCOSE 121*  BUN 24*  CREATININE 1.03  CALCIUM 10.0   Liver Function Tests:  Recent Labs Lab 09/09/15 0229  AST 29  ALT 27  ALKPHOS 121  BILITOT 1.2  PROT 7.8  ALBUMIN 5.6*   CBC:  Recent Labs Lab 09/09/15 0229  WBC 9.6  NEUTROABS 7.3  HGB 15.6  HCT 45.3  MCV 89.2  PLT 299    Signed:  Vassie Loll  [facility] 09/09/2015, 5:54 PM

## 2015-09-09 NOTE — H&P (Addendum)
[facility] History and Physical  Dakota Watson BJY:[id number] DOB: 1991-05-13 DOA: 09/09/2015  Referring physician: EDP PCP: PROVIDER NOT IN SYSTEM   Chief Complaint: OD   HPI: Dakota Watson is a 24 y.o. male with h/o heroin abuse, he presents to the ED due to a drug overdose that occurred PTA.  He explains that he took Phenibut: 10 scoops of  mixing powder that he ordered over the internet from [place].  Apparently Phenibut is a powdered substance that acts as an agonist of the GABA receptor similar to baclofen and / or gabapentin.  Does cross the blood brain barrier.  Can cause respiratory depression and bradycardia.  No specific antidote or cardiotoxicity.  But can take up to 36 hours to wash out of the patients system apparently.  Review of Systems: unable to perform due to mental status  Past Medical History  Diagnosis Date  . Depression   . Allergy   . Substance abuse    History reviewed. No pertinent past surgical history. Social History:  reports that he has been smoking Cigarettes.  He has a 2.5 pack-year smoking history. He has never used smokeless tobacco. He reports that he drinks alcohol. He reports that he uses illicit drugs.  Allergies  Allergen Reactions  . Amoxicillin Rash  . Penicillins Rash    Family History  Problem Relation Age of Onset  . Cancer Neg Hx      Prior to Admission medications   Medication Sig Start Date End Date Taking? Authorizing Provider  doxycycline (VIBRA-TABS) 100 MG tablet Take 1 tablet (100 mg total) by mouth 2 (two) times daily. 07/05/15   Elvina Sidle, MD  gabapentin (NEURONTIN) 600 MG tablet Take 1 tablet (600 mg total) by mouth 3 (three) times daily. 07/05/15   Elvina Sidle, MD  ibuprofen (ADVIL,MOTRIN) 600 MG tablet Take 1 tablet (600 mg total) by mouth every 6 (six) hours as needed. Patient not taking: Reported on 07/05/2015 03/15/15   Tatyana Kirichenko, PA-C  naltrexone (DEPADE) 50 MG tablet Take 1 tablet  (50 mg total) by mouth daily. 05/26/15   Pearline Cables, MD  propranolol (INDERAL) 20 MG tablet Take 1 tablet by mouth daily as needed for anxiety 05/26/15   Gwenlyn Found Copland, MD  QUEtiapine (SEROQUEL) 100 MG tablet Take 1 tablet (100 mg total) by mouth at bedtime. 05/26/15   Pearline Cables, MD   Physical Exam: Filed Vitals:   09/09/15 0216 09/09/15 0301  BP: 128/92 95/62  Pulse: 69 63  Temp: 98.1 F (36.7 C)   Resp: 20 21    BP 95/62 mmHg  Pulse 63  Temp(Src) 98.1 F (36.7 C) (Oral)  Resp 21  SpO2 100%  General Appearance:    Sedated, confused, appears stated age  Head:    Normocephalic, atraumatic  Eyes:    PERRL, EOMI, sclera non-icteric, pupils dialated     Nose:   Nares without drainage or epistaxis. Mucosa, turbinates normal  Throat:   Moist mucous membranes. Oropharynx without erythema or exudate.  Neck:   Supple. No carotid bruits.  No thyromegaly.  No lymphadenopathy.   Back:     No CVA tenderness, no spinal tenderness  Lungs:     Clear to auscultation bilaterally, without wheezes, rhonchi or rales  Chest wall:    No tenderness to palpitation  Heart:    Regular rate and rhythm without murmurs, gallops, rubs  Abdomen:     Soft, non-tender, nondistended, normal bowel sounds, no organomegaly  Genitalia:  deferred  Rectal:    deferred  Extremities:   No clubbing, cyanosis or edema.  Pulses:   2+ and symmetric all extremities  Skin:   Skin color, texture, turgor normal, no rashes or lesions  Lymph nodes:   Cervical, supraclavicular, and axillary nodes normal  Neurologic:   MAE, is lying across the bed, speech delayed    Labs on Admission:  Basic Metabolic Panel:  Recent Labs Lab 09/09/15 0229  NA 145  K 4.2  CL 106  CO2 29  GLUCOSE 121*  BUN 24*  CREATININE 1.03  CALCIUM 10.0   Liver Function Tests:  Recent Labs Lab 09/09/15 0229  AST 29  ALT 27  ALKPHOS 121  BILITOT 1.2  PROT 7.8  ALBUMIN 5.6*   No results for input(s): LIPASE, AMYLASE  in the last 168 hours. No results for input(s): AMMONIA in the last 168 hours. CBC:  Recent Labs Lab 09/09/15 0229  WBC 9.6  NEUTROABS 7.3  HGB 15.6  HCT 45.3  MCV 89.2  PLT 299   Cardiac Enzymes: No results for input(s): CKTOTAL, CKMB, CKMBINDEX, TROPONINI in the last 168 hours.  BNP (last 3 results) No results for input(s): PROBNP in the last 8760 hours. CBG: No results for input(s): GLUCAP in the last 168 hours.  Radiological Exams on Admission: No results found.  EKG: Independently reviewed.  Assessment/Plan Principal Problem:   Drug-induced encephalopathy Active Problems:   Overdose   1. Drug-induced encephalopathy due to overdose of Phenibut - 1. Admitting patient to tele room for observation 2. Apparently this substance may take up to 36 hours to wash out 3. Per poison control there is no specific cardio toxicity to look for 4. But it can cause bradycardia and respiratory depression 5. No specific antidote 6. Per poison control: supportive care and monitoring 7. Sitter ordered to bedside 8. IVF ordered 9. Holding home meds 10. Does not sound like suicide attempt, patient admits: "Just wanted to get high"    Code Status: Full  Family Communication: No family in room Disposition Plan: Admit to obs   Time spent: 70 min  GARDNER, JARED M. [facility] Pager [phone]  If 7AM-7PM, please contact the day team taking care of the patient [email or web address] Password [email or web address] 09/09/2015, 3:56 AM

## 2015-09-09 NOTE — ED Notes (Addendum)
Pt states that he took some 'phenobute' off of the internet and now just feels bad overall. Denies hallucinations. Alert and oriented.

## 2015-09-09 NOTE — ED Provider Notes (Addendum)
CSN: [id number]     Arrival date & time 09/09/15  0159 History  By signing my name below, I, Emmanuella Mensah, attest that this documentation has been prepared under the direction and in the presence of Devoria Albe, MD at 0203. Electronically Signed: Angelene Giovanni, ED Scribe. 09/09/2015. 2:27 AM.    Chief Complaint  Patient presents with  . Drug Overdose   The history is provided by the patient. No language interpreter was used.   Level 5 caveat due to confusion  HPI Comments: Dakota Watson is a 24 y.o. male with a hx of substance abuse (heroin) who presents to the Emergency Department complaining of drug overdose that occurred PTA. He explains that he took phenibut an hour ago that he received from [place] after buying it on the internet. He states it did it to get high. He states that the "phenibut" is a powder but was unable to describe his method of use. He reports associated anxiety. Pt is unable to fully verbalize his symptoms stating that he just "feels bad". He denies hallucinations, either auditory or visual. He reports that he reports here from [facility], a sober living house. He states that he has been living there for 6 months and he is there for heroin abuse and has not had heroin since being at the house. He adds that his roommates at the facility noticed that he was acting inappropriately and brought him here.   Per roommate at [facility] house, pt took the drug yesterday as well. He reports that the pt took 10 scoops of 500 mg of the phenibut mixing the powder with water to drink at approx. 5 pm yesterday.  He had also taken a ? amount  earlier in the day. He reports associated intermittent episodes of being non-responsive with short breaths and the only way to wake him up was to heavily shake the pt.    Past Medical History  Diagnosis Date  . Depression   . Allergy   . Substance abuse    History reviewed. No pertinent past surgical history. Family History  Problem Relation  Age of Onset  . Cancer Neg Hx    Social History  Substance Use Topics  . Smoking status: Current Every Day Smoker -- 0.50 packs/day for 5 years    Types: Cigarettes  . Smokeless tobacco: Never Used  . Alcohol Use: 0.0 oz/week    0 Standard drinks or equivalent per week  states he is unemployed.    Review of Systems  Skin: Negative for wound.  Psychiatric/Behavioral: Positive for confusion. Negative for hallucinations. The patient is nervous/anxious.   All other systems reviewed and are negative.    Allergies  Amoxicillin and Penicillins  Home Medications   Prior to Admission medications   Medication Sig Start Date End Date Taking? Authorizing Provider  doxycycline (VIBRA-TABS) 100 MG tablet Take 1 tablet (100 mg total) by mouth 2 (two) times daily. 07/05/15   Elvina Sidle, MD  gabapentin (NEURONTIN) 600 MG tablet Take 1 tablet (600 mg total) by mouth 3 (three) times daily. 07/05/15   Elvina Sidle, MD  ibuprofen (ADVIL,MOTRIN) 600 MG tablet Take 1 tablet (600 mg total) by mouth every 6 (six) hours as needed. Patient not taking: Reported on 07/05/2015 03/15/15   Tatyana Kirichenko, PA-C  naltrexone (DEPADE) 50 MG tablet Take 1 tablet (50 mg total) by mouth daily. 05/26/15   Gwenlyn Found Copland, MD  propranolol (INDERAL) 20 MG tablet Take 1 tablet by mouth daily as needed  for anxiety 05/26/15   Pearline Cables, MD  QUEtiapine (SEROQUEL) 100 MG tablet Take 1 tablet (100 mg total) by mouth at bedtime. 05/26/15   Gwenlyn Found Copland, MD   BP 128/92 mmHg  Pulse 69  Temp(Src) 98.1 F (36.7 C) (Oral)  Resp 20  SpO2 100%  Vital signs normal   Physical Exam  Constitutional: He appears well-developed and well-nourished.  Non-toxic appearance. He does not appear ill. No distress.  HENT:  Head: Normocephalic and atraumatic.  Right Ear: External ear normal.  Left Ear: External ear normal.  Nose: Nose normal. No mucosal edema or rhinorrhea.  Mouth/Throat: Oropharynx is clear and  moist and mucous membranes are normal. No dental abscesses or uvula swelling.  Eyes: Conjunctivae and EOM are normal. Pupils are equal, round, and reactive to light.  Pupils are dilated.   Neck: Normal range of motion and full passive range of motion without pain. Neck supple.  Cardiovascular: Normal rate, regular rhythm and normal heart sounds.  Exam reveals no gallop and no friction rub.   No murmur heard. Pulmonary/Chest: Effort normal and breath sounds normal. No respiratory distress. He has no wheezes. He has no rhonchi. He has no rales. He exhibits no tenderness and no crepitus.  Abdominal: Soft. Normal appearance and bowel sounds are normal. He exhibits no distension. There is no tenderness. There is no rebound and no guarding.  Musculoskeletal: Normal range of motion. He exhibits no edema or tenderness.  Moves all extremities well.   Neurological: He is alert. He has normal strength. No cranial nerve deficit.  Pt is confused and has difficulty answering simple yes or no questions.   Skin: Skin is warm, dry and intact. No rash noted. No erythema. No pallor.  Psychiatric: His mood appears anxious. His speech is delayed. He is slowed. Cognition and memory are impaired.  Pt becomes tearful because he cannot answer questions.   Nursing note and vitals reviewed.   ED Course  Procedures (including critical care time)  Medications  0.9 %  sodium chloride infusion ( Intravenous New Bag/Given 09/09/15 0239)  heparin injection 5,000 Units (not administered)  sodium chloride 0.9 % injection 3 mL (not administered)  0.9 %  sodium chloride infusion (not administered)    DIAGNOSTIC STUDIES: Oxygen Saturation is 100% on RA, normal by my interpretation.    COORDINATION OF CARE: 2:24 AM- Pt will receive blood work for further evaluation and will research the medication for appropriate treatment.   02:30 Patti, poison control, states not cardiac toxicity, rarely have needed intubation,  supportive care, if not baseline in 6 hrs post ingestion needs admission to tele  03:00 AM pt is laying on his side on stretcher, still not able to communicate, waiting for labs so he can be admitted.   03:33 Dr Julian Reil, will see patient and admit.    Labs Review Results for orders placed or performed during the hospital encounter of 09/09/15  Acetaminophen level  Result Value Ref Range   Acetaminophen (Tylenol), Serum <10 (L) 10 - 30 ug/mL  Comprehensive metabolic panel  Result Value Ref Range   Sodium 145 135 - 145 mmol/L   Potassium 4.2 3.5 - 5.1 mmol/L   Chloride 106 101 - 111 mmol/L   CO2 29 22 - 32 mmol/L   Glucose, Bld 121 (H) 65 - 99 mg/dL   BUN 24 (H) 6 - 20 mg/dL   Creatinine, Ser 1.61 0.61 - 1.24 mg/dL   Calcium 09.6 8.9 - 04.5 mg/dL  Total Protein 7.8 6.5 - 8.1 g/dL   Albumin 5.6 (H) 3.5 - 5.0 g/dL   AST 29 15 - 41 U/L   ALT 27 17 - 63 U/L   Alkaline Phosphatase 121 38 - 126 U/L   Total Bilirubin 1.2 0.3 - 1.2 mg/dL   GFR calc non Af Amer >60 >60 mL/min   GFR calc Af Amer >60 >60 mL/min   Anion gap 10 5 - 15  Ethanol  Result Value Ref Range   Alcohol, Ethyl (B) <5 <5 mg/dL  Salicylate level  Result Value Ref Range   Salicylate Lvl <4.0 2.8 - 30.0 mg/dL  CBC with Differential  Result Value Ref Range   WBC 9.6 4.0 - 10.5 K/uL   RBC 5.08 4.22 - 5.81 MIL/uL   Hemoglobin 15.6 13.0 - 17.0 g/dL   HCT 69.645.3 29.539.0 - 28.452.0 %   MCV 89.2 78.0 - 100.0 fL   MCH 30.7 26.0 - 34.0 pg   MCHC 34.4 30.0 - 36.0 g/dL   RDW 13.212.3 44.011.5 - 10.215.5 %   Platelets 299 150 - 400 K/uL   Neutrophils Relative % 75 %   Neutro Abs 7.3 1.7 - 7.7 K/uL   Lymphocytes Relative 14 %   Lymphs Abs 1.3 0.7 - 4.0 K/uL   Monocytes Relative 9 %   Monocytes Absolute 0.8 0.1 - 1.0 K/uL   Eosinophils Relative 2 %   Eosinophils Absolute 0.2 0.0 - 0.7 K/uL   Basophils Relative 0 %   Basophils Absolute 0.0 0.0 - 0.1 K/uL   Laboratory interpretation all normal except minor elevation of BUN c/w  dehydration     EKG Interpretation  Date/Time:  Thursday September 09 2015 02:24:26 EST Ventricular Rate:  65 PR Interval:  157 QRS Duration: 67 QT Interval:  382 QTC Calculation: 397 R Axis:   76 Text Interpretation:  Sinus arrhythmia Normal ECG No old tracing to compare Confirmed by Sayer Masini  MD-I, Yoselin Amerman ([id number]) on 09/09/2015 2:28:08 AM        Devoria AlbeIva Chade Pitner, MD has personally reviewed and evaluated these images and lab results as part of her medical decision-making.  MDM   Final diagnoses:  Drug overdose, intentional, initial encounter (HCC)  Drug-induced encephalopathy   Plan admission  Devoria AlbeIva Atianna Haidar, MD, FACEP  CRITICAL CARE Performed by: Devoria AlbeKNAPP,Anabella Capshaw L Total critical care time: 31  minutes Critical care time was exclusive of separately billable procedures and treating other patients. Critical care was necessary to treat or prevent imminent or life-threatening deterioration. Critical care was time spent personally by me on the following activities: development of treatment plan with patient and/or surrogate as well as nursing, discussions with consultants, evaluation of patient's response to treatment, examination of patient, obtaining history from patient or surrogate, ordering and performing treatments and interventions, ordering and review of laboratory studies, ordering and review of radiographic studies, pulse oximetry and re-evaluation of patient's condition.    I personally performed the services described in this documentation, which was scribed in my presence. The recorded information has been reviewed and considered.  Devoria AlbeIva Terissa Haffey, MD, Concha PyoFACEP   Camylle Whicker, MD 09/09/15 [id number]  Devoria AlbeIva Earley Grobe, MD 09/09/15 [phone]

## 2015-09-09 NOTE — Progress Notes (Signed)
Pt wants to leave AMA & sign the form, doesn't like to stay here 1 more day. Despite all the explanations, pt still wants to leave.  Friends at bedside to pick up pt.

## 2015-09-09 NOTE — Progress Notes (Signed)
Patient seen and examined. Admitted after midnight secondary to drug overdose. He is alert, awake and oriented X 3 on exam. No CP and no SOB. Patient is afebrile and only abnormality is HR in the 40's range. Please refer to H&P written by Dr. Julian ReilGardner for further info/details on admission.  Plan: -will have psych to see due to OD and clearance given hx of depression; sitter remains at bedside until evaluation -advance diet -monitor on telemetry   Dakota Watson, [name] [phone]

## 2015-09-09 NOTE — ED Notes (Signed)
MD at bedside. 

## 2015-09-09 NOTE — Consult Note (Signed)
[facility] Consult   Reason for Consult:  Polysubstance abuse and intentional drug overdose Referring Physician:  DR. Dyann Kief Patient Identification: Dakota Watson MRN:  [id number] Principal Diagnosis: Drug overdose, intentional [facility]) Diagnosis:   Patient Active Problem List   Diagnosis Date Noted  . Overdose [T50.901A] 09/09/2015  . Drug-induced encephalopathy [G92] 09/09/2015  . Drug overdose, intentional ([place]) [T50.902A]   . Depression [F32.9]     Total Time spent with patient: 1 hour  Subjective:   Dakota Watson is a 24 y.o. male patient admitted with drug overdose.  HPI:  Dakota Watson is a 24 y.o. male seen, chart reviewed for psychiatric consultation and evaluation of intentional/accidental overdose of Phenibut. Patient stated that he is a resident of [place] over 8 years and recently completed 28 days of substance abuse rehabilitation at [facility]. 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 Patient denies current symptoms of depression, anxiety, psychosis, suicidal/homicidal ideation, intention or plans. Patient is willing to participate in intensive outpatient program when medically cleared.   He explains that he took Phenibut: 10 scoops of 56m mixing powder that he ordered over the internet from [place] Apparently Phenibut is a powdered substance that acts as an agonist of the GABA receptor similar to baclofen and / or gabapentin. Does cross the blood brain barrier. Can cause respiratory depression and bradycardia. No specific antidote or cardiotoxicity. But can take up to 36 hours to wash out of the patients system apparently  Past Psychiatric History: [place] [facility]  [place]    Risk to Self: Is patient at risk for suicide?: No Risk to Others:   Prior Inpatient Therapy:   Prior Outpatient Therapy:    Past Medical History:  Past Medical History  Diagnosis Date  . Depression   . Allergy   . Substance abuse    History reviewed. No pertinent past surgical history. Family History:  Family History  Problem Relation Age of Onset  . Cancer Neg Hx    Family Psychiatric  History: Significant for the polysubstance abuse multiple family members at paternal side of the family. Social History:  History  Alcohol Use  . 0.0 oz/week  . 0 Standard drinks or equivalent per week     History  Drug Use  . Yes    Social History   Social History  . Marital Status: Married    Spouse Name: N/A  . Number of Children: N/A  . Years of Education: N/A   Social History Main Topics  . Smoking status: Current Every Day Smoker -- 0.50 packs/day for 5 years    Types: Cigarettes  . Smokeless tobacco: Never Used  . Alcohol Use: 0.0 oz/week    0 Standard drinks or equivalent per week  . Drug Use: Yes  . Sexual Activity: No   Other Topics Concern  . None   Social History Narrative   Additional Social History:                          Allergies:   Allergies  Allergen Reactions  . Amoxicillin Rash  . Penicillins Rash    Labs:  Results for orders placed or performed during the hospital encounter of 09/09/15 (from  the past 48 hour(s))  Acetaminophen level     Status: Abnormal   Collection Time: 09/09/15  2:29 AM  Result Value Ref Range   Acetaminophen (Tylenol), Serum <10 (L) 10 - 30 ug/mL    Comment:        THERAPEUTIC CONCENTRATIONS VARY SIGNIFICANTLY. A RANGE OF 10-30 ug/mL MAY BE AN EFFECTIVE CONCENTRATION FOR MANY PATIENTS. HOWEVER, SOME ARE BEST TREATED AT CONCENTRATIONS OUTSIDE THIS RANGE. ACETAMINOPHEN CONCENTRATIONS >150 ug/mL AT 4 HOURS AFTER INGESTION AND >50 ug/mL AT 12 HOURS AFTER INGESTION ARE OFTEN ASSOCIATED  WITH TOXIC REACTIONS.   Comprehensive metabolic panel     Status: Abnormal   Collection Time: 09/09/15  2:29 AM  Result Value Ref Range   Sodium 145 135 - 145 mmol/L   Potassium 4.2 3.5 - 5.1 mmol/L   Chloride 106 101 - 111 mmol/L   CO2 29 22 - 32 mmol/L   Glucose, Bld 121 (H) 65 - 99 mg/dL   BUN 24 (H) 6 - 20 mg/dL   Creatinine, Ser 1.03 0.61 - 1.24 mg/dL   Calcium 10.0 8.9 - 10.3 mg/dL   Total Protein 7.8 6.5 - 8.1 g/dL   Albumin 5.6 (H) 3.5 - 5.0 g/dL   AST 29 15 - 41 U/L   ALT 27 17 - 63 U/L   Alkaline Phosphatase 121 38 - 126 U/L   Total Bilirubin 1.2 0.3 - 1.2 mg/dL   GFR calc non Af Amer >60 >60 mL/min   GFR calc Af Amer >60 >60 mL/min    Comment: (NOTE) The eGFR has been calculated using the CKD EPI equation. This calculation has not been validated in all clinical situations. eGFR's persistently <60 mL/min signify possible Chronic Kidney Disease.    Anion gap 10 5 - 15  Ethanol     Status: None   Collection Time: 09/09/15  2:29 AM  Result Value Ref Range   Alcohol, Ethyl (B) <5 <5 mg/dL    Comment:        LOWEST DETECTABLE LIMIT FOR SERUM ALCOHOL IS 5 mg/dL FOR MEDICAL PURPOSES ONLY   Salicylate level     Status: None   Collection Time: 09/09/15  2:29 AM  Result Value Ref Range   Salicylate Lvl <3.7 2.8 - 30.0 mg/dL  CBC with Differential     Status: None   Collection Time: 09/09/15  2:29 AM  Result Value Ref Range   WBC 9.6 4.0 - 10.5 K/uL   RBC 5.08 4.22 - 5.81 MIL/uL   Hemoglobin 15.6 13.0 - 17.0 g/dL   HCT 45.3 39.0 - 52.0 %   MCV 89.2 78.0 - 100.0 fL   MCH 30.7 26.0 - 34.0 pg   MCHC 34.4 30.0 - 36.0 g/dL   RDW 12.3 11.5 - 15.5 %   Platelets 299 150 - 400 K/uL   Neutrophils Relative % 75 %   Neutro Abs 7.3 1.7 - 7.7 K/uL   Lymphocytes Relative 14 %   Lymphs Abs 1.3 0.7 - 4.0 K/uL   Monocytes Relative 9 %   Monocytes Absolute 0.8 0.1 - 1.0 K/uL   Eosinophils Relative 2 %   Eosinophils Absolute 0.2 0.0 - 0.7 K/uL   Basophils Relative 0 %    Basophils Absolute 0.0 0.0 - 0.1 K/uL  MRSA PCR Screening     Status: None   Collection Time: 09/09/15  5:38 AM  Result Value Ref Range   MRSA by PCR NEGATIVE NEGATIVE    Comment:  The GeneXpert MRSA Assay (FDA approved for NASAL specimens only), is one component of a comprehensive MRSA colonization surveillance program. It is not intended to diagnose MRSA infection nor to guide or monitor treatment for MRSA infections.     Current Facility-Administered Medications  Medication Dose Route Frequency Provider Last Rate Last Dose  . 0.9 %  sodium chloride infusion   Intravenous Continuous Rolland Porter, MD 100 mL/hr at 09/09/15 0239    . 0.9 %  sodium chloride infusion   Intravenous Continuous Etta Quill, DO 125 mL/hr at 09/09/15 0510    . folic acid (FOLVITE) tablet 1 mg  1 mg Oral Daily Barton Dubois, MD   1 mg at 09/09/15 3419  . heparin injection 5,000 Units  5,000 Units Subcutaneous 3 times per day Etta Quill, DO   5,000 Units at 09/09/15 0608  . nicotine (NICODERM CQ - dosed in mg/24 hours) patch 14 mg  14 mg Transdermal Daily Barton Dubois, MD      . sodium chloride 0.9 % injection 3 mL  3 mL Intravenous Q12H Jared M Gardner, DO      . thiamine (VITAMIN B-1) tablet 100 mg  100 mg Oral Daily Barton Dubois, MD   100 mg at 09/09/15 3790    Musculoskeletal: Strength & Muscle Tone: within normal limits Gait & Station: normal Patient leans: N/A  Psychiatric Specialty Exam: ROS  No Fever-chills, No Headache, No changes with Vision or hearing, reports vertigo No problems swallowing food or Liquids, No Chest pain, Cough or Shortness of Breath, No Abdominal pain, No Nausea or Vommitting, Bowel movements are regular, No Blood in stool or Urine, No dysuria, No new skin rashes or bruises, No new joints pains-aches,  No new weakness, tingling, numbness in any extremity, No recent weight gain or loss, No polyuria, polydypsia or polyphagia,   A full 10 point Review  of Systems was done, except as stated above, all other Review of Systems were negative.  Blood pressure 124/76, pulse 48, temperature 98.2 F (36.8 C), temperature source Oral, resp. rate 20, weight 64.638 kg (142 lb 8 oz), SpO2 97 %.Body mass index is 22.66 kg/(m^2).  General Appearance: Casual  Eye Contact::  Good  Speech:  Clear and Coherent  Volume:  Normal  Mood:  Euthymic  Affect:  Appropriate and Congruent  Thought Process:  Coherent and Goal Directed  Orientation:  Full (Time, Place, and Person)  Thought Content:  WDL  Suicidal Thoughts:  No  Homicidal Thoughts:  No  Memory:  Immediate;   Good Recent;   Good  Judgement:  Impaired  Insight:  Fair  Psychomotor Activity:  Decreased  Concentration:  Good  Recall:  Good  Fund of Knowledge:Good  Language: Good  Akathisia:  Negative  Handed:  Right  AIMS (if indicated):     Assets:  Communication Skills Desire for Improvement Housing Leisure Time Resilience Social Support  ADL's:  Intact  Cognition: WNL  Sleep:      Treatment Plan Summary: Daily contact with patient to assess and evaluate symptoms and progress in treatment and Medication management  Refer to the unit social worker regarding appropriate placement needs when medically stable.  Disposition: Patient does not meet criteria for psychiatric inpatient admission. Supportive therapy provided about ongoing stressors. Refer to IOP. chemical dependency intensive outpatient program    Shandee Jergens,JANARDHAHA R. 09/09/2015 11:52 AM

## 2015-09-09 NOTE — ED Notes (Signed)
Admitting MD at bedside.

## 2015-09-17 ENCOUNTER — Encounter (HOSPITAL_COMMUNITY): Payer: Self-pay | Admitting: Emergency Medicine

## 2015-09-17 ENCOUNTER — Emergency Department (HOSPITAL_COMMUNITY)
Admission: EM | Admit: 2015-09-17 | Discharge: 2015-09-17 | Disposition: A | Discharge status: Home / Self Care | Payer: 59 | Attending: Emergency Medicine | Admitting: Emergency Medicine

## 2015-09-17 DIAGNOSIS — F329 Major depressive disorder, single episode, unspecified: Secondary | ICD-10-CM | POA: Insufficient documentation

## 2015-09-17 DIAGNOSIS — Z880 Allergy status to penicillin: Secondary | ICD-10-CM | POA: Insufficient documentation

## 2015-09-17 DIAGNOSIS — T50904A Poisoning by unspecified drugs, medicaments and biological substances, undetermined, initial encounter: Secondary | ICD-10-CM

## 2015-09-17 DIAGNOSIS — F17210 Nicotine dependence, cigarettes, uncomplicated: Secondary | ICD-10-CM | POA: Insufficient documentation

## 2015-09-17 DIAGNOSIS — Y9389 Activity, other specified: Secondary | ICD-10-CM | POA: Insufficient documentation

## 2015-09-17 DIAGNOSIS — Z79899 Other long term (current) drug therapy: Secondary | ICD-10-CM | POA: Insufficient documentation

## 2015-09-17 DIAGNOSIS — Y998 Other external cause status: Secondary | ICD-10-CM | POA: Insufficient documentation

## 2015-09-17 DIAGNOSIS — T426X4A Poisoning by other antiepileptic and sedative-hypnotic drugs, undetermined, initial encounter: Secondary | ICD-10-CM | POA: Insufficient documentation

## 2015-09-17 DIAGNOSIS — F1410 Cocaine abuse, uncomplicated: Secondary | ICD-10-CM | POA: Insufficient documentation

## 2015-09-17 DIAGNOSIS — Y9289 Other specified places as the place of occurrence of the external cause: Secondary | ICD-10-CM | POA: Insufficient documentation

## 2015-09-17 LAB — CBC WITH DIFFERENTIAL/PLATELET
Basophils Absolute: 0 10*3/uL (ref 0.0–0.1)
Basophils Relative: 0 %
Eosinophils Absolute: 0 10*3/uL (ref 0.0–0.7)
Eosinophils Relative: 0 %
HCT: 40.8 % (ref 39.0–52.0)
Hemoglobin: 14.2 g/dL (ref 13.0–17.0)
Lymphocytes Relative: 11 %
Lymphs Abs: 1.2 10*3/uL (ref 0.7–4.0)
MCH: 30.8 pg (ref 26.0–34.0)
MCHC: 34.8 g/dL (ref 30.0–36.0)
MCV: 88.5 fL (ref 78.0–100.0)
Monocytes Absolute: 0.4 10*3/uL (ref 0.1–1.0)
Monocytes Relative: 4 %
Neutro Abs: 9.3 10*3/uL — ABNORMAL HIGH (ref 1.7–7.7)
Neutrophils Relative %: 85 %
Platelets: 229 10*3/uL (ref 150–400)
RBC: 4.61 MIL/uL (ref 4.22–5.81)
RDW: 12.2 % (ref 11.5–15.5)
WBC: 10.9 10*3/uL — ABNORMAL HIGH (ref 4.0–10.5)

## 2015-09-17 LAB — BASIC METABOLIC PANEL
Anion gap: 11 (ref 5–15)
BUN: 22 mg/dL — ABNORMAL HIGH (ref 6–20)
CO2: 31 mmol/L (ref 22–32)
Calcium: 9.5 mg/dL (ref 8.9–10.3)
Chloride: 98 mmol/L — ABNORMAL LOW (ref 101–111)
Creatinine, Ser: 1.01 mg/dL (ref 0.61–1.24)
GFR calc Af Amer: >60 mL/min (ref >60)
GFR calc non Af Amer: >60 mL/min (ref >60)
Glucose, Bld: 136 mg/dL — ABNORMAL HIGH (ref 65–99)
Potassium: 4.7 mmol/L (ref 3.5–5.1)
Sodium: 140 mmol/L (ref 135–145)

## 2015-09-17 LAB — RAPID URINE DRUG SCREEN, HOSP PERFORMED
Amphetamines: NOT DETECTED
Barbiturates: NOT DETECTED
Benzodiazepines: NOT DETECTED
Cocaine: POSITIVE — AB
Opiates: NOT DETECTED
Tetrahydrocannabinol: NOT DETECTED

## 2015-09-17 LAB — ETHANOL: Alcohol, Ethyl (B): <5 mg/dL (ref <5)

## 2015-09-17 MED ORDER — ACETAMINOPHEN 325 MG PO TABS
650.0000 mg | ORAL_TABLET | Freq: Once | ORAL | Status: AC
  Administered 2015-09-17: 650 mg via ORAL
  Filled 2015-09-17: qty 2

## 2015-09-17 MED ORDER — KETOROLAC TROMETHAMINE 30 MG/ML IJ SOLN
30.0000 mg | Freq: Once | INTRAMUSCULAR | Status: AC
  Administered 2015-09-17: 30 mg via INTRAVENOUS
  Filled 2015-09-17: qty 1

## 2015-09-17 MED ORDER — SODIUM CHLORIDE 0.9 % IV BOLUS (SEPSIS)
1000.0000 mL | Freq: Once | INTRAVENOUS | Status: AC
  Administered 2015-09-17: 1000 mL via INTRAVENOUS

## 2015-09-17 NOTE — ED Provider Notes (Signed)
CSN: [id number]     Arrival date & time 09/17/15  0222 History   First MD Initiated Contact with Patient 09/17/15 0236     Chief Complaint  Patient presents with  . Ingestion     (Consider location/radiation/quality/duration/timing/severity/associated sxs/prior Treatment) Patient is a 24 y.o. male presenting with Ingested Medication.  Ingestion   Patient presents to the emergency department with an episode where he was asleep and the roommate states that the patient had some snoring breathing and seemed to turn bluish color.  Patient did not have any seizure-like activity.  The patient was awakened by the roommate who states that he was seen in his normal state when he awoke him and was not difficult to arouse.  Patient does not have any chest pain, shortness breath, weakness, dizziness, vision, back pain, neck pain, fever, lethargy, near syncope, lightheadedness, nausea, vomiting, diarrhea, abdominal pain, or syncope.  She does complain of toothache and headache.  Patient does have history of substance abuse, specifically narcotics and cocaine.  Patient states he took 3 gabapentin 3 times today they were 800 mg tablets Past Medical History  Diagnosis Date  . Depression   . Allergy   . Substance abuse    History reviewed. No pertinent past surgical history. Family History  Problem Relation Age of Onset  . Cancer Neg Hx    Social History  Substance Use Topics  . Smoking status: Current Every Day Smoker -- 0.50 packs/day for 5 years    Types: Cigarettes  . Smokeless tobacco: Never Used  . Alcohol Use: No     Comment: former    Review of Systems  All other systems negative except as documented in the HPI. All pertinent positives and negatives as reviewed in the HPI.  Allergies  Amoxicillin and Penicillins  Home Medications   Prior to Admission medications   Medication Sig Start Date End Date Taking? Authorizing Provider  gabapentin (NEURONTIN) 800 MG tablet Take 800 mg by  mouth 4 (four) times daily. 08/30/15  Yes Historical Provider, MD  gabapentin (NEURONTIN) 600 MG tablet Take 1 tablet (600 mg total) by mouth 3 (three) times daily. Patient not taking: Reported on 09/09/2015 07/05/15   Elvina SidleKurt Lauenstein, MD  naltrexone (DEPADE) 50 MG tablet Take 1 tablet (50 mg total) by mouth daily. Patient not taking: Reported on 09/09/2015 05/26/15   Pearline CablesJessica C Copland, MD  propranolol (INDERAL) 20 MG tablet Take 1 tablet by mouth daily as needed for anxiety Patient not taking: Reported on 09/09/2015 05/26/15   Pearline CablesJessica C Copland, MD  QUEtiapine (SEROQUEL) 100 MG tablet Take 1 tablet (100 mg total) by mouth at bedtime. Patient not taking: Reported on 09/09/2015 05/26/15   Gwenlyn FoundJessica C Copland, MD   BP 145/81 mmHg  Pulse 110  Temp(Src) 98.7 F (37.1 C) (Oral)  Resp 20  SpO2 97% Physical Exam  Constitutional: He is oriented to person, place, and time. He appears well-developed and well-nourished. No distress.  HENT:  Head: Normocephalic and atraumatic.  Mouth/Throat: Oropharynx is clear and moist.  Eyes: Pupils are equal, round, and reactive to light.  Neck: Normal range of motion. Neck supple.  Cardiovascular: Normal rate, regular rhythm and normal heart sounds.  Exam reveals no gallop and no friction rub.   No murmur heard. Pulmonary/Chest: Effort normal and breath sounds normal. No respiratory distress. He has no wheezes.  Abdominal: Soft. Bowel sounds are normal. He exhibits no distension. There is no tenderness.  Neurological: He is alert and oriented to person,  place, and time. He exhibits normal muscle tone. Coordination normal.  Skin: Skin is warm and dry. No rash noted. No erythema.  Psychiatric: He has a normal mood and affect. His behavior is normal.  Nursing note and vitals reviewed.   ED Course  Procedures (including critical care time) Labs Review Labs Reviewed  BASIC METABOLIC PANEL - Abnormal; Notable for the following:    Chloride 98 (*)    Glucose, Bld  136 (*)    BUN 22 (*)    All other components within normal limits  CBC WITH DIFFERENTIAL/PLATELET - Abnormal; Notable for the following:    WBC 10.9 (*)    Neutro Abs 9.3 (*)    All other components within normal limits  URINE RAPID DRUG SCREEN, HOSP PERFORMED - Abnormal; Notable for the following:    Cocaine POSITIVE (*)    All other components within normal limits  ETHANOL    Imaging Review No results found. I have personally reviewed and evaluated these images and lab results as part of my medical decision-making.   EKG Interpretation None     Patient will be discharged home, told to return here as needed.  The patient's been stable here in the emergency Department.  No acute distress.  The patient took her gabapentin.  For pain control and this is a drug that can be used for getting high, at higher doses.  The patient is advised return here as needed.  Patient has had recent hospital visit for over using another medication   Charlestine Night, PA-C 09/17/15 0546  Dione Booze, MD 09/17/15 [id number]

## 2015-09-17 NOTE — Discharge Instructions (Signed)
Return here as needed.  Follow-up with a primary care doctor, increase her fluid intake and rest as much possible

## 2015-09-17 NOTE — ED Notes (Signed)
Pt states he has had a migraine headache all day to the point he had vomiting  Pt's friend states tonight pt was sitting up in a chair and made a gurgling noise in his throat, eyes closed, and pt turned blue in his face  Episode lasted about 10 seconds  Pt states he also took too much gabapentin today  Pt states normally he takes 3 tabs per day but today he took 3 tabs 3 separate times

## 2015-09-17 NOTE — ED Notes (Signed)
Pt is requesting pain medication for tooth pain. Explained that ketorolac which was given to him earlier is a non narcotic pain medication and he should have some pain relief shortly. He sts it's not helping, will notify PA
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
## 2019-09-26 DEATH — deceased
